# Patient Record
Sex: Female | Born: 1995 | Race: Black or African American | Hispanic: No | Smoking: Never smoker
Health system: Southern US, Community
[De-identification: ages and names within clinical notes are randomized; demographics above are authoritative.]

## PROBLEM LIST (undated history)

## (undated) DIAGNOSIS — D259 Leiomyoma of uterus, unspecified: Secondary | ICD-10-CM

## (undated) DIAGNOSIS — G43909 Migraine, unspecified, not intractable, without status migrainosus: Secondary | ICD-10-CM

## (undated) DIAGNOSIS — O3412 Maternal care for benign tumor of corpus uteri, second trimester: Secondary | ICD-10-CM

## (undated) DIAGNOSIS — R569 Unspecified convulsions: Secondary | ICD-10-CM

## (undated) DIAGNOSIS — F4310 Post-traumatic stress disorder, unspecified: Secondary | ICD-10-CM

## (undated) DIAGNOSIS — D649 Anemia, unspecified: Secondary | ICD-10-CM

## (undated) DIAGNOSIS — T8859XA Other complications of anesthesia, initial encounter: Secondary | ICD-10-CM

## (undated) DIAGNOSIS — K602 Anal fissure, unspecified: Secondary | ICD-10-CM

## (undated) DIAGNOSIS — K449 Diaphragmatic hernia without obstruction or gangrene: Secondary | ICD-10-CM

## (undated) DIAGNOSIS — F410 Panic disorder [episodic paroxysmal anxiety] without agoraphobia: Secondary | ICD-10-CM

## (undated) HISTORY — DX: Panic disorder (episodic paroxysmal anxiety): F41.0

## (undated) HISTORY — DX: Anal fissure, unspecified: K60.2

## (undated) HISTORY — DX: Leiomyoma of uterus, unspecified: D25.9

## (undated) HISTORY — DX: Unspecified convulsions: R56.9

## (undated) HISTORY — DX: Leiomyoma of uterus, unspecified: O34.12

## (undated) HISTORY — PX: ERCP W/ SPHICTEROTOMY: SHX1523

## (undated) HISTORY — PX: SPHINCTEROTOMY: SHX5279

## (undated) HISTORY — DX: Anemia, unspecified: D64.9

## (undated) HISTORY — DX: Other complications of anesthesia, initial encounter: T88.59XA

## (undated) HISTORY — DX: Migraine, unspecified, not intractable, without status migrainosus: G43.909

## (undated) HISTORY — DX: Post-traumatic stress disorder, unspecified: F43.10

## (undated) HISTORY — DX: Diaphragmatic hernia without obstruction or gangrene: K44.9

---
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
## 2016-06-08 DIAGNOSIS — F4329 Adjustment disorder with other symptoms: Secondary | ICD-10-CM | POA: Insufficient documentation

## 2016-07-26 DIAGNOSIS — F430 Acute stress reaction: Secondary | ICD-10-CM | POA: Insufficient documentation

## 2017-06-26 ENCOUNTER — Emergency Department
Admission: EM | Admit: 2017-06-26 | Discharge: 2017-06-26 | Disposition: A | Discharge status: Home / Self Care | Payer: TRICARE Prime—HMO | Attending: Emergency Medicine | Admitting: Emergency Medicine

## 2017-06-26 DIAGNOSIS — F410 Panic disorder [episodic paroxysmal anxiety] without agoraphobia: Secondary | ICD-10-CM | POA: Insufficient documentation

## 2017-06-26 DIAGNOSIS — Z8659 Personal history of other mental and behavioral disorders: Secondary | ICD-10-CM

## 2017-06-26 DIAGNOSIS — F419 Anxiety disorder, unspecified: Secondary | ICD-10-CM | POA: Insufficient documentation

## 2017-06-26 MED ORDER — LORAZEPAM 0.5 MG PO TABS
0.5000 mg | ORAL_TABLET | Freq: Once | ORAL | Status: AC
Start: 2017-06-26 — End: 2017-06-26
  Administered 2017-06-26: 13:00:00 0.5 mg via ORAL
  Filled 2017-06-26: qty 1

## 2017-06-26 NOTE — ED Notes (Signed)
NP at bedside.

## 2017-06-26 NOTE — ED Notes (Signed)
Bed: E14  Expected date:   Expected time:   Means of arrival:   Comments:

## 2017-06-26 NOTE — Discharge Instructions (Signed)
Take medication as prescribed. Rest, stay well hydrated, avoid stressful triggers.  Important to keep psychiatry appointment on Friday.  Return to the ER with any worsening of condition or concerns.

## 2017-06-26 NOTE — ED Provider Notes (Signed)
EMERGENCY DEPARTMENT NOTE    Physician/Midlevel provider first contact with patient: 06/26/17 1259         HISTORY OF PRESENT ILLNESS   Historian:Patient  Translator Used: No    Chief Complaint: Panic Attack     Mechanism of Injury:       21 y.o. female with history of panic attacks and other significant medical history listed below brought in by ambulance for having panic attack prior to arrival. Reports driving, had argument with husband, had a panic attack, pulled over and call 911. Reports was taking of Seroquel for anxiety but stopped. Takes Zoloft. Reports having psychiatry appointment on Friday. Reports palpitations, resolving SOB. Denies chest pain, fever, cough, congestion. Reviewed chart. Patient in no acute distress.    1. Location of symptoms: Panic attack  2. Onset of symptoms: Prior to arrival today  3. What was patient doing when symptoms started (Context): see above  4. Severity: moderate  5. Timing: Sudden  6. Activities that worsen symptoms: Argument   7. Activities that improve symptoms:   8. Quality: 0/10 pain  9. Radiation of symptoms: no  10. Associated signs and Symptoms: see above  11. Are symptoms worsening? Yes    MEDICAL HISTORY     Past Medical History:  Past Medical History:   Diagnosis Date   . Asthma    . Panic attacks        Past Surgical History:  No past surgical history on file.    Social History:  Social History     Social History   . Marital status: Married     Spouse name: N/A   . Number of children: N/A   . Years of education: N/A     Occupational History   . Not on file.     Social History Main Topics   . Smoking status: Never Smoker   . Smokeless tobacco: Never Used   . Alcohol use No   . Drug use: No   . Sexual activity: Not on file     Other Topics Concern   . Not on file     Social History Narrative   . No narrative on file       Family History:  No family history on file.    Outpatient Medication:  Discharge Medication List as of 06/26/2017  2:03 PM      CONTINUE these  medications which have NOT CHANGED    Details   sertraline (ZOLOFT) 100 MG tablet Take 100 mg by mouth daily., Historical Med               REVIEW OF SYSTEMS   Review of Systems   Constitutional: Negative for fever.   HENT: Negative for congestion.    Respiratory: Negative for cough and shortness of breath.    Cardiovascular: Positive for palpitations. Negative for chest pain.   Gastrointestinal: Negative for abdominal pain, diarrhea, nausea and vomiting.   Musculoskeletal: Negative for back pain and neck pain.   Neurological: Negative for dizziness, tingling, sensory change, loss of consciousness and headaches.   Psychiatric/Behavioral: Negative for suicidal ideas. The patient is nervous/anxious.    All other systems reviewed and are negative.       PHYSICAL EXAM     ED Triage Vitals [06/26/17 1253]   Enc Vitals Group      BP 123/80      Heart Rate 91      Resp Rate 20      Temp 99.1  F (37.3 C)      Temp Source Oral      SpO2 97 %      Weight 59.1 kg      Height       Head Circumference       Peak Flow       Pain Score 0      Pain Loc       Pain Edu?       Excl. in GC?      Vitals:    06/26/17 1253 06/26/17 1420   BP: 123/80 119/75   Pulse: 91 93   Resp: 20 16   Temp: 99.1 F (37.3 C) 98.1 F (36.7 C)   TempSrc: Oral Oral   SpO2: 97% 100%   Weight: 59.1 kg      Nursing note and vitals reviewed.  Constitutional:  Well developed, well nourished. Awake & Oriented x3.  Head:  Atraumatic. Normocephalic.    ENT:  Patent airway.  Neck:  Supple. Full ROM.    Cardiovascular:  Regular rate. Regular rhythm. No murmurs, rubs, or gallops.  Pulmonary/Chest:  No evidence of respiratory distress. Clear to auscultation bilaterally.  No wheezing, rales or rhonchi.   Abdominal:  Soft and non-distended. There is no tenderness. No rebound, guarding, or rigidity. Normoactive bowel sounds.  Back:  Full ROM. Nontender.  Extremities:  No edema. No cyanosis. No clubbing. Full range of motion in all extremities.  Skin:  Skin is warm and  dry.  No diaphoresis. No rash.   Neurological:  Alert, awake, and appropriate. Normal speech. Motor normal.  Psychiatric:  Good eye contact. Normal interaction, affect, and behavior.      MEDICAL DECISION MAKING     DISCUSSION    Denies discomfort at this time. Will give anxiety medication.  EKG to evaluate for arrhythmia, STEMI; see interpretation below. No concern for infectious processes requiring CBC at this time. Very little concern for ACS, PE at this time.  Most likely panic attack/anxiety.    Patient indicates improvement. Discussed medication, rest, hydration, avoiding stressful triggers, importance of keeping psychiatry appointment on Friday, when to return to the ER.    Vital Signs: Reviewed the patient?s vital signs.   Nursing Notes: Reviewed and utilized available nursing notes.  Medical Records Reviewed: Reviewed available past medical records.  Counseling: The emergency provider has spoken with the patient and discussed today?s findings, in addition to providing specific details for the plan of care.  Questions are answered and there is agreement with the plan.        CARDIAC STUDIES    The following cardiac studies were independently interpreted by the Emergency Medicine Physician.  For full cardiac study results please see chart.    EKG Interpretation:  Signed and interpreted by ED NP   Time Interpreted: 1321  Comparison: None  Rate: 90  Rhythm: NSR  Axis: normal  Intervals: normal  Blocks: no blocks  ST segments: no elevation  Interpretation: Nonspecific  EKG, NSR with no comparison      RADIOLOGY IMAGING STUDIES      No orders to display           PULSE OXIMETRY    Oxygen Saturation by Pulse Oximetry: 100%  Interventions: none  Interpretation:  WNL, interpreted by me.    EMERGENCY DEPT. MEDICATIONS      ED Medication Orders     Start Ordered     Status Ordering Provider    06/26/17 1309 06/26/17 1308  LORazepam (ATIVAN) tablet 0.5 mg  Once     Route: Oral  Ordered Dose: 0.5 mg     Last MAR action:   Given Maryruth Apple P          LABORATORY RESULTS    Ordered and independently interpreted AVAILABLE laboratory tests. Please see results section in chart for full details.  Results for orders placed or performed during the hospital encounter of 06/26/17   ECG 12 lead   Result Value Ref Range    Ventricular Rate 90 BPM    Atrial Rate 90 BPM    P-R Interval 138 ms    QRS Duration 82 ms    Q-T Interval 346 ms    QTC Calculation (Bezet) 423 ms    P Axis 69 degrees    R Axis 57 degrees    T Axis 30 degrees       CRITICAL CARE/PROCEDURES    Procedures  None    DIAGNOSIS      Diagnosis:  Final diagnoses:   Panic attack   History of anxiety       Disposition:  ED Disposition     ED Disposition Condition Date/Time Comment    Discharge  Wed Jun 26, 2017  2:03 PM Clabe Seal Bale discharge to home/self care.    Condition at disposition: Stable          Prescriptions:  Discharge Medication List as of 06/26/2017  2:03 PM      CONTINUE these medications which have NOT CHANGED    Details   sertraline (ZOLOFT) 100 MG tablet Take 100 mg by mouth daily., Historical Med                Toma Aran, [place]  06/30/17 [phone]

## 2017-06-29 LAB — ECG 12-LEAD
Atrial Rate: 90 {beats}/min
P Axis: 69 degrees
P-R Interval: 138 ms
Q-T Interval: 346 ms
QRS Duration: 82 ms
QTC Calculation (Bezet): 423 ms
R Axis: 57 degrees
T Axis: 30 degrees
Ventricular Rate: 90 {beats}/min

## 2017-07-03 ENCOUNTER — Emergency Department: Payer: TRICARE Prime—HMO

## 2017-07-03 ENCOUNTER — Emergency Department
Admission: EM | Admit: 2017-07-03 | Discharge: 2017-07-03 | Disposition: A | Discharge status: Home / Self Care | Payer: TRICARE Prime—HMO | Attending: Emergency Medicine | Admitting: Emergency Medicine

## 2017-07-03 DIAGNOSIS — S63502A Unspecified sprain of left wrist, initial encounter: Secondary | ICD-10-CM | POA: Insufficient documentation

## 2017-07-03 DIAGNOSIS — W19XXXA Unspecified fall, initial encounter: Secondary | ICD-10-CM | POA: Insufficient documentation

## 2017-07-03 MED ORDER — IBUPROFEN 400 MG PO TABS
400.0000 mg | ORAL_TABLET | Freq: Four times a day (QID) | ORAL | 0 refills | Status: AC | PRN
Start: 2017-07-03 — End: ?

## 2017-07-03 MED ORDER — IBUPROFEN 200 MG PO TABS
400.0000 mg | ORAL_TABLET | Freq: Once | ORAL | Status: AC
Start: 2017-07-03 — End: 2017-07-03
  Administered 2017-07-03: 21:00:00 400 mg via ORAL
  Filled 2017-07-03: qty 2

## 2017-07-03 NOTE — ED Triage Notes (Signed)
Kelly Sosa is a 22 y.o. female c/o left wrist pain s/p fell this morning denies taking any medication

## 2017-07-03 NOTE — ED Provider Notes (Signed)
EMERGENCY DEPARTMENT NOTE    Physician/Midlevel provider first contact with patient: 07/03/17 2059         HISTORY OF PRESENT ILLNESS   Historian: patient  Translator Used: no    21 y.o. female presents with left wrist and hand pain s/p fall.    1. Location of symptoms: see above  2. Onset of symptoms: this morning  3. What was patient doing when symptoms started (Context): left foosh injury  4. Severity: severe  5. Timing: acute onset, constant  6. Activities that worsen symptoms: movement of left hand/wrist  7. Activities that improve symptoms: resting left hand/wrist  8. Quality: sharp pain   9. Radiation of symptoms: down left hand fingers  10. Associated signs and Symptoms: no numbness, weakness   11. Are symptoms worsening? yes          MEDICAL HISTORY     Past Medical History:  Past Medical History:   Diagnosis Date   . Asthma    . Panic attacks        Past Surgical History:  History reviewed. No pertinent surgical history.    Social History:  Social History     Social History   . Marital status: Married     Spouse name: N/A   . Number of children: N/A   . Years of education: N/A     Occupational History   . Not on file.     Social History Main Topics   . Smoking status: Never Smoker   . Smokeless tobacco: Never Used   . Alcohol use No   . Drug use: No   . Sexual activity: Not on file     Other Topics Concern   . Not on file     Social History Narrative   . No narrative on file       Family History:  History reviewed. No pertinent family history.    Outpatient Medication:  Discharge Medication List as of 07/03/2017  9:56 PM      CONTINUE these medications which have NOT CHANGED    Details   sertraline (ZOLOFT) 100 MG tablet Take 100 mg by mouth daily., Historical Med             Allergies:  Allergies   Allergen Reactions   . Azithromycin Anaphylaxis       REVIEW OF SYSTEMS   Review of Systems   Musculoskeletal:        Left wrist, hand pain   All other systems reviewed and are negative.        PHYSICAL EXAM      Vitals:    07/03/17 2205   BP: 132/85   Pulse: 82   Resp: 16   Temp:    SpO2: 95%       Nursing note and vitals reviewed.    Constitutional: non-toxic apperaing  Head: Atraumatic.  Eyes: No scleral icterus.  ENT: Mucous membranes are moist and intact.   Neck: Supple.  Cardiovascular: strong regular left radial pulse  Pulmonary/Chest: No evidence of respiratory distress.   GI: Soft, non-distended abdomen.   Extremities:   Left hand, wrist- no deformity, no swelling, diffuse tenderness, no true snuff box tenderness, normal rom, normal strength  Skin: No rash.   Neurological: Awake, alert and oriented x 3. CN II-XII intact. Moving all ext.  Psychiatric: Appropriate affect. Appropriate mood. Appropriate behavior.    MEDICAL DECISION MAKING   X-rays left wrist and hand to rule out fx.  X-rays negative  Volar splint placed by tech on left wrist/hand. I inspected the splint, hand. Left hand remains nv intact per my exam.  Discussed plan including ortho follow up if pain persists over next week. D/c home.    DISCUSSION      Vital Signs: Reviewed the patient?s vital signs.   Nursing Notes: Reviewed and utilized available nursing notes.  Medical Records Reviewed: Reviewed available past medical records.  Counseling: The emergency provider has spoken with the patient and discussed today?s findings, in addition to providing specific details for the plan of care.  Questions are answered and there is agreement with the plan.    IMAGING STUDIES    The following imaging studies were independently interpreted by the Emergency Medicine Physician.  For full imaging study results please see chart.    CARDIAC STUDIES     The following cardiac studies were independently interpreted by the Emergency Medicine Physician. For full cardiac study results please see chart     PULSE OXIMETRY        EMERGENCY DEPT. MEDICATIONS      ED Medication Orders     Start Ordered     Status Ordering Provider    07/03/17 2119 07/03/17 2118  ibuprofen  (ADVIL,MOTRIN) tablet 400 mg  Once     Route: Oral  Ordered Dose: 400 mg     Last MAR action:  Given Arsenia Goracke WINDSOR          LABORATORY RESULTS    Ordered and independently interpreted AVAILABLE laboratory tests. Please see results section in chart for full details.      ATTESTATIONS      Physician Attestation: Darlyn Read MD, have been the primary provider for Thereasa Sosa during this Emergency Dept visit and have reviewed the chart for accuracy and agree with its content.     DIAGNOSIS      Diagnosis:  Final diagnoses:   Sprain of left wrist, initial encounter       Disposition:  ED Disposition     ED Disposition Condition Date/Time Comment    Discharge  Wed Jul 03, 2017  9:56 PM [name] discharge to home/self care.    Condition at disposition: Stable          Prescriptions:  Discharge Medication List as of 07/03/2017  9:56 PM      START taking these medications    Details   ibuprofen (ADVIL,MOTRIN) 400 MG tablet Take 1 tablet (400 mg total) by mouth every 6 (six) hours as needed for Pain., Starting Wed 07/03/2017, Print         CONTINUE these medications which have NOT CHANGED    Details   sertraline (ZOLOFT) 100 MG tablet Take 100 mg by mouth daily., Historical Med                [name], MD  07/03/17 2308

## 2017-07-19 DIAGNOSIS — F603 Borderline personality disorder: Secondary | ICD-10-CM | POA: Insufficient documentation

## 2017-08-07 DIAGNOSIS — F32A Depression, unspecified: Secondary | ICD-10-CM | POA: Insufficient documentation

## 2018-01-09 ENCOUNTER — Emergency Department
Admission: EM | Admit: 2018-01-09 | Discharge: 2018-01-09 | Disposition: A | Discharge status: Home / Self Care | Payer: TRICARE Prime—HMO | Attending: Emergency Medicine | Admitting: Emergency Medicine

## 2018-01-09 DIAGNOSIS — Z5321 Procedure and treatment not carried out due to patient leaving prior to being seen by health care provider: Secondary | ICD-10-CM | POA: Insufficient documentation

## 2018-01-09 HISTORY — DX: Anal fissure, unspecified: K60.2

## 2018-01-09 MED ORDER — ACETAMINOPHEN 325 MG PO TABS
650.0000 mg | ORAL_TABLET | Freq: Once | ORAL | Status: AC
Start: 2018-01-09 — End: 2018-01-09
  Administered 2018-01-09: 21:00:00 650 mg via ORAL

## 2018-01-17 ENCOUNTER — Emergency Department
Admission: EM | Admit: 2018-01-17 | Discharge: 2018-01-17 | Disposition: A | Discharge status: Home / Self Care | Payer: TRICARE Prime—HMO | Attending: General Practice | Admitting: General Practice

## 2018-01-17 DIAGNOSIS — B9689 Other specified bacterial agents as the cause of diseases classified elsewhere: Secondary | ICD-10-CM

## 2018-01-17 DIAGNOSIS — Z3A10 10 weeks gestation of pregnancy: Secondary | ICD-10-CM | POA: Insufficient documentation

## 2018-01-17 DIAGNOSIS — O219 Vomiting of pregnancy, unspecified: Secondary | ICD-10-CM | POA: Insufficient documentation

## 2018-01-17 DIAGNOSIS — N898 Other specified noninflammatory disorders of vagina: Secondary | ICD-10-CM

## 2018-01-17 DIAGNOSIS — O23591 Infection of other part of genital tract in pregnancy, first trimester: Secondary | ICD-10-CM | POA: Insufficient documentation

## 2018-01-17 LAB — URINALYSIS, REFLEX TO MICROSCOPIC EXAM IF INDICATED
Bilirubin, UA: NEGATIVE
Blood, UA: NEGATIVE
Glucose, UA: NEGATIVE
Ketones UA: NEGATIVE
Leukocyte Esterase, UA: NEGATIVE
Nitrite, UA: NEGATIVE
Protein, UR: NEGATIVE
Specific Gravity UA: 1.01 (ref 1.001–1.035)
Urine pH: 8 (ref 5.0–8.0)
Urobilinogen, UA: <2 mg/dL

## 2018-01-17 MED ORDER — DOXYLAMINE SUCCINATE (SLEEP) 25 MG PO TABS
25.0000 mg | ORAL_TABLET | Freq: Every evening | ORAL | 0 refills | Status: AC | PRN
Start: 2018-01-17 — End: ?

## 2018-01-17 MED ORDER — VITAMIN B-6 25 MG PO TABS
25.0000 mg | ORAL_TABLET | Freq: Three times a day (TID) | ORAL | 0 refills | Status: AC | PRN
Start: 2018-01-17 — End: 2018-02-16

## 2018-01-17 MED ORDER — SODIUM CHLORIDE 0.9 % IV BOLUS
1000.0000 mL | Freq: Once | INTRAVENOUS | Status: AC
Start: 2018-01-17 — End: 2018-01-17
  Administered 2018-01-17: 22:00:00 1000 mL via INTRAVENOUS

## 2018-01-17 MED ORDER — VITAMIN B-6 50 MG PO TABS
25.0000 mg | ORAL_TABLET | Freq: Every day | ORAL | Status: DC
Start: 2018-01-17 — End: 2018-01-17

## 2018-01-17 MED ORDER — VITAMIN B-6 50 MG PO TABS
25.0000 mg | ORAL_TABLET | Freq: Three times a day (TID) | ORAL | 0 refills | Status: DC | PRN
Start: 2018-01-17 — End: 2018-01-17

## 2018-01-17 MED ORDER — METRONIDAZOLE 500 MG PO TABS
500.0000 mg | ORAL_TABLET | Freq: Two times a day (BID) | ORAL | 0 refills | Status: AC
Start: 2018-01-17 — End: 2018-01-24

## 2018-01-17 NOTE — EDIE (Signed)
COLLECTIVE?NOTIFICATION?01/17/2018 20:10?Hippert, Gay A?MRN: [id number]    Criteria Met      5 ED Visits in 12 Months    Security and Safety  No recent Security Events currently on file    ED Care Guidelines  There are currently no ED Care Guidelines for this patient. Please check your facility's medical records system.          E.D. Visit Count (12 mo.)  Facility Visits   [place] [facility] 3   [place] [facility]: [facility] 1   [facility]   Total 5   Note: Visits indicate total known visits.      Recent Emergency Department Visit Summary  Date Facility [facility] Type Diagnoses or Chief Complaint   Jan 17, 2018 [place] [facility]: [facility] [place] [facility]      Emesis      Jan 09, 2018 [place] [facility] [place] [facility]      triage- anal fissures concerns for blood clot ( pregnant)      Hemorrhoids      Jul 03, 2017 [place] [facility] [place] [facility]      left wrist      Wrist Pain      Unspecified sprain of left wrist, initial encounter      Jun 26, 2017 [place] [facility] [place] [facility]      MEDIC      Panic Attack      Personal history of other mental and behavioral disorders      Panic disorder [episodic paroxysmal anxiety]      May 03, 2017 [facility] [place] [facility]      Other acute postprocedural pain      Other specified diseases of anus and rectum          Recent Inpatient Visit Summary  No recorded inpatient visits.     Care Providers  There are no care providers on record at this time.   Collective Portal  This patient has registered at the [facility] Emergency Room: [facility] Emergency Department   For more information visit: [email or web address]     PLEASE NOTE:    1.   Any care recommendations and other clinical information are provided as guidelines or for historical purposes only, and providers should exercise their own clinical  judgment when providing care.    2.   You may only use this information for purposes of treatment, payment or health care operations activities, and subject to the limitations of applicable Collective Policies.    3.   You should consult directly with the organization that provided a care guideline or other clinical history with any questions about additional information or accuracy or completeness of information provided.    ? 2019 [place], [employer] - [email or web address]

## 2018-01-17 NOTE — ED Notes (Signed)
Pt discharged to home. Pt A&O x 4, resp is reg and unlabored, skin is warm and dry. Discharge instructions give and information about F/U care with questions answered. Pt verbalize understanding of  Instructions. Pt ambulating to lobby with steady gait.

## 2018-01-17 NOTE — ED Triage Notes (Addendum)
Pt sts that she has been vomiting today multiple times. Pt also C/O groin pain/pressure with urination. Pt has had vaginal spotting x 2 but is not spotting today. She has received a dose of Rhogam at the end of April

## 2018-01-17 NOTE — ED Provider Notes (Signed)
EMERGENCY DEPARTMENT NOTE    Physician/Midlevel provider first contact with patient: 01/17/18 2012         HISTORY OF PRESENT ILLNESS   Historian:Patient  Translator Used: No    Chief Complaint: Emesis     Mechanism of Injury:       22 y.o. female G1 at 10 weeks by last menstrual period presents to ED with nausea and vomiting as well as foul smelling vaginal discharge.  Patient states throughout her pregnancy she has had nausea and normally one episode of emesis each day, however today has vomited 3 times.  Not currently on any medication for nausea of pregnancy.  Patient also states she has had white, foul smelling discharge for one week that started after she had sexual intercourse, sexual partner was wearing a condom.  Denies F/C, abd pain, dysuria, hematuria, vaginal bleeding, diarrhea, abdominal trauma.      MEDICAL HISTORY     Past Medical History:  Past Medical History:   Diagnosis Date   . Anal fissure     pt states hx of anal fissures 5.23.19   . Panic attacks        Past Surgical History:  Past Surgical History:   Procedure Laterality Date   . anal      spinterotomy       Social History:  Social History     Social History   . Marital status: Married     Spouse name: N/A   . Number of children: N/A   . Years of education: N/A     Occupational History   . Not on file.     Social History Main Topics   . Smoking status: Never Smoker   . Smokeless tobacco: Never Used   . Alcohol use No   . Drug use: No   . Sexual activity: Not on file     Other Topics Concern   . Not on file     Social History Narrative   . No narrative on file       Family History:  History reviewed. No pertinent family history.    Outpatient Medication:  Discharge Medication List as of 01/17/2018 10:05 PM      CONTINUE these medications which have NOT CHANGED    Details   ibuprofen (ADVIL,MOTRIN) 400 MG tablet Take 1 tablet (400 mg total) by mouth every 6 (six) hours as needed for Pain., Starting Wed 07/03/2017, Print               REVIEW OF  SYSTEMS   Review of Systems   Gastrointestinal: Positive for nausea and vomiting.   All other systems reviewed and are negative.           PHYSICAL EXAM     ED Triage Vitals   Enc Vitals Group      BP 01/17/18 2017 125/69      Heart Rate 01/17/18 2017 82      Resp Rate 01/17/18 2017 18      Temp 01/17/18 2017 98.8 F (37.1 C)      Temp src --       SpO2 01/17/18 2017 98 %      Weight 01/17/18 2017 59 kg      Height 01/17/18 2017 1.626 m      Head Circumference --       Peak Flow --       Pain Score 01/17/18 2027 5      Pain Loc --  Pain Edu? --       Excl. in GC? --      Physical Exam   Constitutional: She appears well-developed. No distress.   HENT:   Head: Normocephalic and atraumatic.   Eyes: Conjunctivae and EOM are normal.   Neck: Normal range of motion. Neck supple.   Cardiovascular: Normal rate.    Pulmonary/Chest: Effort normal.   Abdominal: Soft. She exhibits no distension. There is tenderness. There is no guarding.   +epigastric TTP   Genitourinary: Vaginal discharge found.   Genitourinary Comments: +yellow/white vaginal discharge   No blood noted in vaginal canal    Musculoskeletal: Normal range of motion.   Neurological: She is alert.   Skin: Skin is warm. She is not diaphoretic.   Psychiatric: She has a normal mood and affect.   Nursing note and vitals reviewed.        MEDICAL DECISION MAKING     DISCUSSION    Well appearing, NAD in ED.  Discussed treatment options for nausea and vomiting of pregnancy, will discharge on vitamin b6 and unisom.  IVFs given in ED for hydration.  UA negative for infection.  Pelvic exam consistent with bacterial vaginosis, metronidazole prescribed.  Trichomoniasis screening negative.  GC/Chlamydia pending.  Patient aware of pending cultures and comfortable with discharge on treatment as above, will receive call if cultures positive.  OB follow up recommended and return precautions given.  All questions answered.        Vital Signs: Reviewed the patient?s vital signs.    Nursing Notes: Reviewed and utilized available nursing notes.  Medical Records Reviewed: Reviewed available past medical records.  Counseling: The emergency provider has spoken with the patient and discussed today?s findings, in addition to providing specific details for the plan of care.  Questions are answered and there is agreement with the plan.      MIPS DOCUMENTATION        CARDIAC STUDIES    The following cardiac studies were independently interpreted by the Emergency Medicine Physician.  For full cardiac study results please see chart.    Monitor Strip  Interpreted by ED Physician  Rate: 82  Rhythm: NSR   ST Changes: none      EMERGENCY IMAGING STUDIES    The following imagine studies were independently interpreted by me (emergency physician):        RADIOLOGY IMAGING STUDIES      No orders to display           PULSE OXIMETRY    Oxygen Saturation by Pulse Oximetry: 100%  Interventions: none  Interpretation:  Normal     EMERGENCY DEPT. MEDICATIONS      ED Medication Orders     Start Ordered     Status Ordering Provider    01/17/18 2130 01/17/18 2129  sodium chloride 0.9 % bolus 1,000 mL  Once     Route: Intravenous  Ordered Dose: 1,000 mL     Last MAR action:  Stopped Serapio Edelson L    01/17/18 2053 01/17/18 2052  vitamin B-6 (PYRIDOXINE) tablet 25 mg  Daily     Route: Oral  Ordered Dose: 25 mg     Last MAR action:  Not Given Adleigh Mcmasters L          LABORATORY RESULTS    Ordered and independently interpreted AVAILABLE laboratory tests. Please see results section in chart for full details.  Results for orders placed or performed during the hospital encounter of 01/17/18  UA with reflex to micro (all hospital ED's and [facility], pts 3 + yrs)   Result Value Ref Range    Urine Type Clean Catch     Color, UA Yellow Clear - Yellow    Clarity, UA Clear Clear - Hazy    Specific Gravity UA 1.010 1.001 - 1.035    Urine pH 8.0 5.0 - 8.0    Leukocyte Esterase, UA NEGATIVE Negative    Nitrite, UA  NEGATIVE Negative    Protein, UR NEGATIVE Negative    Glucose, UA NEGATIVE Negative    Ketones UA NEGATIVE Negative    Urobilinogen, UA <2.0 0.2 - 2.0 mg/dL    Bilirubin, UA NEGATIVE Negative    Blood, UA NEGATIVE Negative       CRITICAL CARE/PROCEDURES    Procedures      DIAGNOSIS      Diagnosis:  Final diagnoses:   Vomiting or nausea of pregnancy   Vaginal discharge   Bacterial vaginosis       Disposition:  ED Disposition     ED Disposition Condition Date/Time Comment    Discharge  Fri Jan 17, 2018  9:47 PM Clabe Seal Hawn discharge to home/self care.    Condition at disposition: Stable          Prescriptions:  Discharge Medication List as of 01/17/2018 10:05 PM      START taking these medications    Details   doxylamine succinate (UNISOM) 25 MG tablet Take 1 tablet (25 mg total) by mouth nightly as needed (nausea), Starting Fri 01/17/2018, Print      metroNIDAZOLE (FLAGYL) 500 MG tablet Take 1 tablet (500 mg total) by mouth 2 (two) times daily for 7 days, Starting Fri 01/17/2018, Until Fri 01/24/2018, Print      Pyridoxine HCl (VITAMIN B-6) 25 MG tablet Take 1 tablet (25 mg total) by mouth 3 (three) times daily as needed (nausea), Starting Fri 01/17/2018, Until Sun 02/16/2018, Print         CONTINUE these medications which have NOT CHANGED    Details   ibuprofen (ADVIL,MOTRIN) 400 MG tablet Take 1 tablet (400 mg total) by mouth every 6 (six) hours as needed for Pain., Starting Wed 07/03/2017, Print                [place], Lavontae Cornia L, DO  01/17/18 2236

## 2018-01-17 NOTE — Discharge Instructions (Signed)
Antibiotics     Some Important Information You Need To Know.     Antibiotics are strong medicines that can kill bacteria. They can save lives. They keep bacterial infections from becoming more serious. Antibiotics do not work against infections that are caused by viruses. They will not make viral illnesses shorter. Overall, infections caused by viruses are much more common than those caused by bacteria.     Viral infections include:   Bronchitis.   Colds.   Flu (influenza).   Most coughs.   Most ear infections.   Most sore throats.   Stomach flu (viral gastroenteritis).    Bacterial infections include:   Bladder infections.   Skin and wound infections.   Sinus infections that last more than 2 weeks.   Some ear infections.   Strep throat (about 10% (1 in 10) of sore throats).   Pneumonia (lung infection).    It is very important to limit the use of antibiotic medicines to treat only bacterial infections. When antibiotics are given for a viral illness, it can be hard to know if symptoms, including rash, are caused by the virus or an allergy to an antibiotic. This can cause problems in the future when antibiotics are needed to treat a bacterial infection.     Using antibiotics when they are not needed can cause bacteria to become resistant. It can also happen when antibiotics are not taken correctly. This means that the standard antibiotics may not be able to kill the bacteria that is making you ill.    Infections caused by drug-resistant bacteria:   Are much harder to treat.   Need more tests.   May require you to be checked into the hospital.   Are more expensive to treat.   Have a higher chance of complications including death.    Antibiotics can also have side effects. The most common side effects are nausea (sick to the stomach), vomiting (throwing up), diarrhea, and rashes.               Vaginal Discharge    You have been diagnosed with a vaginal discharge.    Discharge (drainage) from  the vagina often means there is an inflammation in the vagina. This is called "vaginitis." Some other vaginitis symptoms are: Itching, burning and odor. Urinating (peeing) may be uncomfortable.    The most common causes were checked during the evaluation today. However, other possible causes may need further evaluation by your family doctor or gynecologist.    Vaginal discharge and vaginitis have many causes. Some are:   Hormonal changes from menopause in older women.   Yeast Infections (Candida vaginitis): This happens when yeast normally living in the vagina grows out of control. The normal (good) bacteria living in the vagina can be killed or weakened. This allows the yeast to overgrow. This can happen if you used an antibiotic recently. Recent illness and long-term diabetes can weaken the immune system. This means your body may not be able to keep the yeast under control.   Nonspecific vaginitis: This results from abnormal bacterial overgrowth of the bacteria that usually live in the vagina. This may be caused by recent illness. It can also be caused by some medicines and douching. Gardnerella vaginitis (also called "Bacterial Vaginosis") is a common infection that may result from overgrowth. Rarely, Gardnerella may be a sexually transmitted disease.   Sexually Transmitted Diseases: Some STDs cause a lot of frothy discharge that smells very bad and is green. Trichomoniasis ("trich")  is one such STD. Chlamydia can also cause discharge. However, it may have little or no symptoms of which you are aware. Chlamydia may cause a severe (serious) disease called Pelvic Inflammatory Disease (PID). It may also cause the reproductive organs to scar, which causes chronic (ongoing) pelvic pain and infertility (the inability to have babies). Gonorrhea "GC" may cause heavy purulent (pus-like) discharge. It can also cause PID. Most STDs pass easily between sexual partners. You and your sexual partner(s) would need to be  treated.   Viral vaginitis: Herpes Simplex is often the cause. This causes a thin, watery discharge. It is linked with painful blisters on the vagina and swelling of the vaginal lips. Urination and sexual intercourse (sex) may be painful. It is a very infectious STD. Symptoms can be treated but there is no cure.   Allergic vaginitis: The vagina gets irritated from coming into contact with certain substances. These include latex condoms, spermicidal jellies, soaps and bubble baths. It also includes vaginal douche products.    If it is confirmed or suspected that you have an STD, tell your sexual partner(s). Then your partner can be checked by a doctor or the local health department.    To protect yourself and your sexual partner from STDs, use a condom during intercourse (sex), especially if your diagnosis is uncertain and you are waiting for the results of pelvic cultures done during this visit.    The health care provider who saw you feels it is OK to send you home.    You may need to return here or go to the nearest Emergency Department if more symptoms develop. This might mean that you are having complications like worsening infection or bleeding. There could also be some other undiagnosed problem.    It is important to have a follow-up. See your gynecologist or family doctor in the next few days. The medical staff may have told your doctor about this visit. If not, be sure to do so yourself.   If you don't have the right follow-up care available, tell members of the medical staff before you go. They can help make arrangements for you.    YOU SHOULD SEEK MEDICAL ATTENTION IMMEDIATELY, EITHER HERE OR AT THE NEAREST EMERGENCY DEPARTMENT, IF ANY OF THE FOLLOWING OCCURS:   Increasing pain in the abdomen (belly), pelvis or back.   Large amounts of vaginal discharge or bleeding, passing large blood clots.   Fever (temperature higher than 100.72F / 38C), chills, nausea, vomiting (throwing up).   You are  dizzy, lightheaded, or pass out.

## 2018-01-20 LAB — GENITAL CHLAMYDIA/NEISSERIA BY PCR
Chlamydia DNA by PCR: NEGATIVE
Neisseria gonorrhoeae by PCR: NEGATIVE

## 2019-07-14 ENCOUNTER — Other Ambulatory Visit: Payer: Self-pay

## 2019-07-14 ENCOUNTER — Emergency Department (HOSPITAL_COMMUNITY)
Admission: EM | Admit: 2019-07-14 | Discharge: 2019-07-14 | Disposition: A | Discharge status: Home / Self Care | Payer: No Typology Code available for payment source | Attending: Emergency Medicine | Admitting: Emergency Medicine

## 2019-07-14 ENCOUNTER — Encounter (HOSPITAL_COMMUNITY): Payer: Self-pay

## 2019-07-14 ENCOUNTER — Ambulatory Visit (INDEPENDENT_AMBULATORY_CARE_PROVIDER_SITE_OTHER)
Admission: EM | Admit: 2019-07-14 | Discharge: 2019-07-14 | Disposition: A | Discharge status: Home / Self Care | Payer: No Typology Code available for payment source | Source: Home / Self Care

## 2019-07-14 DIAGNOSIS — J039 Acute tonsillitis, unspecified: Secondary | ICD-10-CM

## 2019-07-14 DIAGNOSIS — J029 Acute pharyngitis, unspecified: Secondary | ICD-10-CM

## 2019-07-14 LAB — GROUP A STREP BY PCR: Group A Strep by PCR: NOT DETECTED

## 2019-07-14 MED ORDER — DOXYCYCLINE HYCLATE 100 MG PO CAPS: 100.0000 mg | ORAL_CAPSULE | Freq: Two times a day (BID) | ORAL | 0 refills | Status: AC

## 2019-07-14 MED ORDER — LIDOCAINE VISCOUS HCL 2 % MT SOLN: 15.0000 mL | OROMUCOSAL | 0 refills | Status: DC | PRN

## 2019-07-14 MED ORDER — METHYLPREDNISOLONE 4 MG PO TBPK: ORAL_TABLET | ORAL | 0 refills | Status: DC

## 2019-07-14 MED ORDER — IBUPROFEN 600 MG PO TABS: 600.0000 mg | ORAL_TABLET | Freq: Four times a day (QID) | ORAL | 0 refills | Status: DC | PRN

## 2019-07-14 NOTE — ED Triage Notes (Signed)
Patient c/o sore throat pain since x 2 days. patienat denies any fever.

## 2019-07-14 NOTE — Discharge Instructions (Addendum)
Ibuprofen 600 mg every 6 hours as needed for pain.  We will call you if your strep test is positive and call you in an antibiotic if that is the case.  Return to the ER if symptoms worsen or change.

## 2019-07-14 NOTE — Discharge Instructions (Addendum)
°  While your throat is sore, eat soft or liquid foods, such as sherbet, soups, or instant breakfast drinks. Drink warm liquids. Eat frozen ice pops. Rest as much as possible and get plenty of sleep. Gargle with a salt-water mixture 3-4 times a day or as needed. To make a salt-water mixture, completely dissolve -1 tsp of salt in 1 cup of warm water. Wash your hands regularly with soap and water. If soap and water are not available, use hand sanitizer. Do not share cups, bottles, or other utensils until your symptoms have gone away.  Take Care!  Aldona Bar

## 2019-07-14 NOTE — ED Provider Notes (Signed)
[facility]    CSN: [id number] Arrival date & time: 07/14/19  1049      History   Chief Complaint Chief Complaint  Patient presents with  . Sore Throat    HPI Gina Pearson is a 23 y.o. female.   Subjective:   Gina Pearson is a 23 y.o. female who presents for evaluation of a sore throat. She denies any fevers, chills, congestion, headache, myalgias, nasal blockage, post nasal drip, shortness of breath, cough, nausea, vomiting, diarrhea, swollen glands, wheezing or white spots in throat. Onset of symptoms was 3 days ago and has been gradually worsening since that time. She is drinking plenty of fluids but reports that it is difficult for her to swallow due to the pain. She denies any recent close exposure to someone with proven streptococcal pharyngitis or COVID-19.   Notably, she was evaluated a few hours ago for the same at [facility] ED. She had a strep PCR done. She subsequently had to leave because her son was getting cranky. She was not given any prescriptions at the time.   The following portions of the patient's history were reviewed and updated as appropriate: allergies, current medications, past family history, past medical history, past social history, past surgical history and problem list.       History reviewed. No pertinent past medical history.  There are no active problems to display for this patient.   Past Surgical History:  Procedure Laterality Date  . CESAREAN SECTION    . ERCP W/ SPHICTEROTOMY      OB History   No obstetric history on file.      Home Medications    Prior to Admission medications   Medication Sig Start Date End Date Taking? Authorizing Provider  doxycycline (VIBRAMYCIN) 100 MG capsule Take 1 capsule (100 mg total) by mouth 2 (two) times daily for 7 days. 07/14/19 07/21/19  Enrique Sack, FNP  ibuprofen (ADVIL) 600 MG tablet Take 1 tablet (600 mg total) by mouth every 6 (six) hours as needed. 07/14/19   Enrique Sack, FNP  lidocaine (XYLOCAINE) 2 % solution Use as directed 15 mLs in the mouth or throat every 3 (three) hours as needed for mouth pain. Gargle with 15 mLs every 3 hours as needed for sore throat 07/14/19   Enrique Sack, FNP  methylPREDNISolone (MEDROL DOSEPAK) 4 MG TBPK tablet Take as directed 07/14/19   Enrique Sack, FNP    Family History Family History  Family history unknown: Yes    Social History Social History   Tobacco Use  . Smoking status: Never Smoker  . Smokeless tobacco: Never Used  Substance Use Topics  . Alcohol use: Not on file  . Drug use: Not on file     Allergies   Azithromycin   Review of Systems Review of Systems  Constitutional: Negative for chills and fever.  HENT: Positive for sore throat. Negative for trouble swallowing.   Respiratory: Negative for cough and shortness of breath.   Gastrointestinal: Negative for diarrhea, nausea and vomiting.  Musculoskeletal: Negative for myalgias.  Neurological: Negative for dizziness and headaches.     Physical Exam Triage Vital Signs ED Triage Vitals  Enc Vitals Group     BP 07/14/19 1122 132/89     Pulse Rate 07/14/19 1122 78     Resp 07/14/19 1122 17     Temp 07/14/19 1122 99.3 F (37.4 C)     Temp Source 07/14/19 1122 Oral     SpO2 07/14/19  1122 98 %     Weight --      Height --      Head Circumference --      Peak Flow --      Pain Score 07/14/19 1118 9     Pain Loc --      Pain Edu? --      Excl. in [place]? --    No data found.  Updated Vital Signs BP 132/89 (BP Location: Right Arm)   Pulse 78   Temp 99.3 F (37.4 C) (Oral)   Resp 17   LMP 06/21/2019   SpO2 98%   Visual Acuity Right Eye Distance:   Left Eye Distance:   Bilateral Distance:    Right Eye Near:   Left Eye Near:    Bilateral Near:     Physical Exam Vitals signs reviewed.  Constitutional:      Appearance: She is well-developed.  HENT:     Head: Normocephalic.     Right Ear: Tympanic membrane and  ear canal normal.     Left Ear: Tympanic membrane and ear canal normal.     Mouth/Throat:     Lips: Pink.     Mouth: Mucous membranes are moist.     Pharynx: Pharyngeal swelling and posterior oropharyngeal erythema present.     Tonsils: No tonsillar exudate or tonsillar abscesses.   Eyes:     Conjunctiva/sclera: Conjunctivae normal.     Pupils: Pupils are equal, round, and reactive to light.  Neck:     Musculoskeletal: Normal range of motion and neck supple.  Cardiovascular:     Rate and Rhythm: Normal rate and regular rhythm.  Pulmonary:     Effort: Pulmonary effort is normal.     Breath sounds: Normal breath sounds.  Skin:    General: Skin is warm and dry.  Neurological:     Mental Status: She is alert.      UC Treatments / Results  Labs (all labs ordered are listed, but only abnormal results are displayed) Labs Reviewed - No data to display  EKG   Radiology No results found.  Procedures Procedures (including critical care time)  Medications Ordered in UC Medications - No data to display  Initial Impression / Assessment and Plan / UC Course  I have reviewed the triage vital signs and the nursing notes.  Pertinent labs & imaging results that were available during my care of the patient were reviewed by me and considered in my medical decision making (see chart for details).    22 yo female presenting with pharyngitis/tonsilitis. Strep PCR pending. Patient afebrile. Nontoxic appearing. She is able to swallow effectively and manage oral secretions without difficulty. Patient placed on antibiotics and steroids.  Use of OTC analgesics recommended as well as salt water gargles. Follow up as needed.  Today's evaluation has revealed no signs of a dangerous process. Discussed diagnosis with patient and/or guardian. Patient and/or guardian aware of their diagnosis, possible red flag symptoms to watch out for and need for close follow up. Patient and/or guardian understands  verbal and written discharge instructions. Patient and/or guardian comfortable with plan and disposition.  Patient and/or guardian has a clear mental status at this time, good insight into illness (after discussion and teaching) and has clear judgment to make decisions regarding their care  This care was provided during an unprecedented National Emergency due to the Novel Coronavirus (COVID-19) pandemic. COVID-19 infections and transmission risks place heavy strains on healthcare resources.  As  this pandemic evolves, our facility, providers, and staff strive to respond fluidly, to remain operational, and to provide care relative to available resources and information. Outcomes are unpredictable and treatments are without well-defined guidelines. Further, the impact of COVID-19 on all aspects of urgent care, including the impact to patients seeking care for reasons other than COVID-19, is unavoidable during this national emergency. At this time of the global pandemic, management of patients has significantly changed, even for non-COVID positive patients given high local and regional COVID volumes at this time requiring high healthcare system and resource utilization. The standard of care for management of both COVID suspected and non-COVID suspected patients continues to change rapidly at the local, regional, national, and global levels. This patient was worked up and treated to the best available but ever changing evidence and resources available at this current time.   Documentation was completed with the aid of voice recognition software. Transcription may contain typographical errors.  Final Clinical Impressions(s) / UC Diagnoses   Final diagnoses:  Acute tonsillitis, unspecified etiology     Discharge Instructions       While your throat is sore, eat soft or liquid foods, such as sherbet, soups, or instant breakfast drinks.  Drink warm liquids.  Eat frozen ice pops.  Rest as much as  possible and get plenty of sleep.  Gargle with a salt-water mixture 3-4 times a day or as needed. To make a salt-water mixture, completely dissolve -1 tsp of salt in 1 cup of warm water.  Wash your hands regularly with soap and water. If soap and water are not available, use hand sanitizer.  Do not share cups, bottles, or other utensils until your symptoms have gone away.  Take Care!  [facility]     ED Prescriptions    Medication Sig Dispense Auth. Provider   doxycycline (VIBRAMYCIN) 100 MG capsule Take 1 capsule (100 mg total) by mouth 2 (two) times daily for 7 days. 14 capsule Enrique Sack, FNP   methylPREDNISolone (MEDROL DOSEPAK) 4 MG TBPK tablet Take as directed 21 tablet Meral Geissinger, FNP   lidocaine (XYLOCAINE) 2 % solution Use as directed 15 mLs in the mouth or throat every 3 (three) hours as needed for mouth pain. Gargle with 15 mLs every 3 hours as needed for sore throat 120 mL Enrique Sack, FNP   ibuprofen (ADVIL) 600 MG tablet Take 1 tablet (600 mg total) by mouth every 6 (six) hours as needed. 30 tablet Enrique Sack, FNP     PDMP not reviewed this encounter.   Enrique Sack,  07/14/19 1148

## 2019-07-14 NOTE — ED Triage Notes (Signed)
Pt presents with sore throat X 5 days .

## 2019-07-14 NOTE — ED Provider Notes (Signed)
[facility] DEPT Provider Note   CSN: [id number] Arrival date & time: 07/14/19  0911     History   Chief Complaint Chief Complaint  Patient presents with  . Sore Throat    HPI Gina Pearson is a 23 y.o. female.     Patient is a 23 year old female with no significant past medical history.  She presents today with complaints of sore throat.  Symptoms have been present for 2 days and worsening.  She describes discomfort to the right side of her throat that is worse when she swallows or eats.  She denies any fevers or chills.  She denies any ill contacts.  The history is provided by the patient.  Sore Throat This is a new problem. The current episode started 2 days ago. The problem occurs constantly. The problem has been gradually worsening. Pertinent negatives include no abdominal pain and no shortness of breath. The symptoms are aggravated by swallowing. Nothing relieves the symptoms. She has tried nothing for the symptoms.    History reviewed. No pertinent past medical history.  There are no active problems to display for this patient.   Past Surgical History:  Procedure Laterality Date  . CESAREAN SECTION    . SPHINCTEROTOMY       OB History   No obstetric history on file.      Home Medications    Prior to Admission medications   Not on File    Family History Family History  Problem Relation Age of Onset  . Hypertension Mother     Social History Social History   Tobacco Use  . Smoking status: Never Smoker  . Smokeless tobacco: Never Used  Substance Use Topics  . Alcohol use: Yes  . Drug use: Never     Allergies   Azithromycin   Review of Systems Review of Systems  Respiratory: Negative for shortness of breath.   Gastrointestinal: Negative for abdominal pain.  All other systems reviewed and are negative.    Physical Exam Updated Vital Signs BP 134/84   Pulse 86   Temp 98.2 F (36.8 C) (Oral)   Resp 15    Ht 5\' 4"  (1.626 m)   Wt 60.3 kg   LMP 06/21/2019 (Approximate)   SpO2 100%   BMI 22.83 kg/m   Physical Exam Vitals signs and nursing note reviewed.  Constitutional:      General: She is not in acute distress.    Appearance: She is well-developed. She is not diaphoretic.  HENT:     Head: Normocephalic and atraumatic.     Mouth/Throat:     Mouth: Mucous membranes are moist.     Pharynx: Posterior oropharyngeal erythema present. No pharyngeal swelling or oropharyngeal exudate.     Tonsils: No tonsillar exudate or tonsillar abscesses.  Neck:     Musculoskeletal: Normal range of motion and neck supple.  Cardiovascular:     Rate and Rhythm: Normal rate and regular rhythm.     Heart sounds: No murmur. No friction rub. No gallop.   Pulmonary:     Effort: Pulmonary effort is normal. No respiratory distress.     Breath sounds: Normal breath sounds. No wheezing.  Abdominal:     General: Bowel sounds are normal. There is no distension.     Palpations: Abdomen is soft.     Tenderness: There is no abdominal tenderness.  Musculoskeletal: Normal range of motion.  Skin:    General: Skin is warm and dry.  Neurological:  Mental Status: She is alert and oriented to person, place, and time.      ED Treatments / Results  Labs (all labs ordered are listed, but only abnormal results are displayed) Labs Reviewed  GROUP A STREP BY PCR    EKG None  Radiology No results found.  Procedures Procedures (including critical care time)  Medications Ordered in ED Medications - No data to display   Initial Impression / Assessment and Plan / ED Course  I have reviewed the triage vital signs and the nursing notes.  Pertinent labs & imaging results that were available during my care of the patient were reviewed by me and considered in my medical decision making (see chart for details).  Patient presenting with complaints of sore throat.  A strep test will be obtained.  Patient states  that she needs to leave.  She will be discharged and notified if her strep test returns positive.  To return as needed for any problems.  Final Clinical Impressions(s) / ED Diagnoses   Final diagnoses:  None    ED Discharge Orders    None       Veryl Speak, MD 07/14/19 1014

## 2020-03-12 ENCOUNTER — Ambulatory Visit (HOSPITAL_COMMUNITY)
Admission: EM | Admit: 2020-03-12 | Discharge: 2020-03-12 | Disposition: A | Discharge status: Home / Self Care | Payer: No Typology Code available for payment source | Attending: Family Medicine | Admitting: Family Medicine

## 2020-03-12 ENCOUNTER — Other Ambulatory Visit: Payer: Self-pay

## 2020-03-12 ENCOUNTER — Encounter (HOSPITAL_COMMUNITY): Payer: Self-pay

## 2020-03-12 DIAGNOSIS — Z3202 Encounter for pregnancy test, result negative: Secondary | ICD-10-CM | POA: Diagnosis not present

## 2020-03-12 DIAGNOSIS — N898 Other specified noninflammatory disorders of vagina: Secondary | ICD-10-CM | POA: Insufficient documentation

## 2020-03-12 LAB — POCT URINALYSIS DIP (DEVICE)
Glucose, UA: NEGATIVE mg/dL
Hgb urine dipstick: NEGATIVE
Leukocytes,Ua: NEGATIVE
Nitrite: NEGATIVE
Protein, ur: 100 mg/dL — AB
Specific Gravity, Urine: 1.025 (ref 1.005–1.030)
Urobilinogen, UA: 1 mg/dL (ref 0.0–1.0)
pH: 7 (ref 5.0–8.0)

## 2020-03-12 LAB — POC URINE PREG, ED: Preg Test, Ur: NEGATIVE
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
## 2020-03-12 NOTE — Discharge Instructions (Addendum)
Your swab tests are pending.  If your test results are positive, we will call you.  You may need additional treatment and your partner(s) may also need treatment.      Results will also be available via mychart  Follow up as needed
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
## 2020-03-14 LAB — CERVICOVAGINAL ANCILLARY ONLY
Bacterial Vaginitis (gardnerella): NEGATIVE
Candida Glabrata: NEGATIVE
Candida Vaginitis: NEGATIVE
Chlamydia: NEGATIVE
Comment: NEGATIVE
Comment: NEGATIVE
Comment: NEGATIVE
Comment: NEGATIVE
Comment: NEGATIVE
Comment: NORMAL
Neisseria Gonorrhea: NEGATIVE
Trichomonas: NEGATIVE

## 2020-11-03 ENCOUNTER — Encounter (HOSPITAL_BASED_OUTPATIENT_CLINIC_OR_DEPARTMENT_OTHER): Payer: Self-pay

## 2021-08-17 ENCOUNTER — Telehealth: Payer: Self-pay | Admitting: *Deleted

## 2021-08-17 NOTE — Telephone Encounter (Signed)
Has not been scheduled for New OB appointment from referral from [place] as of this time today. Gina Pearson is having spotting off and on for the last 2 weeks that has now turned to dark red blood as of today. Also having bad headaches, light headed, and 10 lb. Wt. Loss. Clinical staff has advised that patient have MAU evaluation due to bleeding in early pregnancy. LMP is 07/02/21 and is [redacted]w[redacted]d according to LMP, to rule out Ectopic or miscarriage. Patient will schedule New OB appointment after MAU evaluation.

## 2021-09-04 ENCOUNTER — Encounter: Payer: Self-pay | Admitting: *Deleted

## 2021-09-08 ENCOUNTER — Encounter: Payer: Self-pay | Admitting: Obstetrics and Gynecology

## 2021-09-08 ENCOUNTER — Ambulatory Visit (INDEPENDENT_AMBULATORY_CARE_PROVIDER_SITE_OTHER): Payer: No Typology Code available for payment source | Admitting: Obstetrics and Gynecology

## 2021-09-08 ENCOUNTER — Other Ambulatory Visit (HOSPITAL_COMMUNITY)
Admission: RE | Admit: 2021-09-08 | Discharge: 2021-09-08 | Disposition: A | Discharge status: Home / Self Care | Source: Ambulatory Visit | Attending: Obstetrics and Gynecology | Admitting: Obstetrics and Gynecology

## 2021-09-08 ENCOUNTER — Other Ambulatory Visit: Payer: Self-pay

## 2021-09-08 DIAGNOSIS — Z6791 Unspecified blood type, Rh negative: Secondary | ICD-10-CM | POA: Diagnosis not present

## 2021-09-08 DIAGNOSIS — Z3480 Encounter for supervision of other normal pregnancy, unspecified trimester: Secondary | ICD-10-CM | POA: Insufficient documentation

## 2021-09-08 DIAGNOSIS — F445 Conversion disorder with seizures or convulsions: Secondary | ICD-10-CM | POA: Insufficient documentation

## 2021-09-08 NOTE — Progress Notes (Signed)
Bedside U/S shows single IUP with FHT of 176  CRL measures 30.79mm  GA 10w which is consistent with her early U/S in [place]

## 2021-09-08 NOTE — Progress Notes (Signed)
History:   Gina Pearson is a 26 y.o. G2P1001 at [redacted]w[redacted]d by LMP, early ultrasound being seen today for her first obstetrical visit.  Her obstetrical history is significant for pseudoseizure, anemia,  and migraines.  Patient does intend to breast feed. Pregnancy history fully reviewed. Unplanned pregnancy, however doing ok with it.   History of Seizures- pseudo seizures, had full workup with Neuro including EEG which was negative. She is not on medications.   Patient reports no complaints. Reports increased stress with pregnancy. She is almost done with school. 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 Her and partner are not longer together, however partner desires relationship with baby.       HISTORY: OB History  Gravida Para Term Preterm AB Living  2 1 1  0 0 1  SAB IAB Ectopic Multiple Live Births  0 0 0 0 0    # Outcome Date GA Lbr Len/2nd Weight Sex Delivery Anes PTL Lv  2 Current           1 Term 08/01/18     CS-[id number]       Last pap smear was done 2022 with biopsy. [facility]   Past Medical History:  Diagnosis Date   Anal fissure    Anal fissure    Anemia    Hiatal hernia    Migraines    PTSD (post-traumatic stress disorder)    Seizures ([place])    Past Surgical History:  Procedure Laterality Date   CESAREAN SECTION     ERCP W/ SPHICTEROTOMY     SPHINCTEROTOMY     Family History  Problem Relation Age of Onset   Hypertension Mother    Social History   Tobacco Use   Smoking status: Never   Smokeless tobacco: Never  Vaping Use   Vaping Use: Never used  Substance Use Topics   Alcohol use: Yes   Drug use: Never   Allergies  Allergen Reactions   Azithromycin     Other reaction(s): Airway constriction   Ceftriaxone Other (See Comments)   Other     Patient reports she was told by [facility] Pulmonologist that she is allergic to dust mites.    Sumatriptan     Other reaction(s): Dysphagia   No current outpatient medications on file  prior to visit.   No current facility-administered medications on file prior to visit.    Review of Systems Pertinent items noted in HPI and remainder of comprehensive ROS otherwise negative. Physical Exam:  There were no vitals filed for this visit. Fetal Heart Rate (bpm): 176 Uterus:     System: General: well-developed, well-nourished female in no acute distress   Breasts:  normal appearance, no masses or tenderness bilaterally   Skin: normal coloration and turgor, no rashes   Neurologic: oriented, normal, negative, normal mood   Extremities: normal strength, tone, and muscle mass, ROM of all joints is normal   HEENT PERRLA, extraocular movement intact and sclera clear, anicteric   Mouth/Teeth mucous membranes moist, pharynx normal without lesions and dental hygiene good   Neck supple and no masses   Cardiovascular: regular rate and rhythm   Respiratory:  no respiratory distress, normal breath sounds   Abdomen: soft, non-tender; bowel sounds normal; no masses,  no organomegaly     Assessment:    Pregnancy: G2P1001 Patient Active Problem List   Diagnosis Date Noted   Supervision of other normal pregnancy, antepartum 09/08/2021   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 Supervision of other normal pregnancy, antepartum  - Culture, OB Urine - GC/Chlamydia probe amp ([facility])not at [facility] - Obstetric panel - HIV antibody (with reflex) - Hepatitis C Antibody - Enroll Patient in PreNatal Babyscripts - [place] bedside; Future  2. Pseudoseizure   Initial labs drawn. Continue prenatal vitamins. Genetic Screening discussed, NIPS: requested. Ultrasound discussed; fetal anatomic survey: requested. Problem list reviewed and updated. The nature of [place] with multiple MDs and other Advanced Practice Providers was explained to patient; also emphasized that residents, students are part of our team. Routine obstetric precautions  reviewed. Return 12 weeks for lab work, then 4 weeks for OB visit. Please have her sign consent for Pap results.Gina Pearson, Gina Pearson for [employer], [place]

## 2021-09-08 NOTE — Progress Notes (Signed)
Had spotting last week- none since Pt states she has painful cramping Pt requests NIPs Pt states she had pap smear in 2022 and it was normal- I don't see it in the records

## 2021-09-10 LAB — CULTURE, OB URINE

## 2021-09-10 LAB — URINE CULTURE, OB REFLEX: Organism ID, Bacteria: NO GROWTH

## 2021-09-11 LAB — GC/CHLAMYDIA PROBE AMP (~~LOC~~) NOT AT ARMC
Chlamydia: NEGATIVE
Comment: NEGATIVE
Comment: NORMAL
Neisseria Gonorrhea: NEGATIVE

## 2021-09-22 ENCOUNTER — Other Ambulatory Visit: Payer: Self-pay

## 2021-09-22 ENCOUNTER — Ambulatory Visit (INDEPENDENT_AMBULATORY_CARE_PROVIDER_SITE_OTHER): Payer: No Typology Code available for payment source

## 2021-09-22 DIAGNOSIS — Z3480 Encounter for supervision of other normal pregnancy, unspecified trimester: Secondary | ICD-10-CM

## 2021-09-22 DIAGNOSIS — Z3481 Encounter for supervision of other normal pregnancy, first trimester: Secondary | ICD-10-CM | POA: Diagnosis not present

## 2021-09-22 DIAGNOSIS — Z3A12 12 weeks gestation of pregnancy: Secondary | ICD-10-CM

## 2021-09-22 NOTE — Progress Notes (Addendum)
Pt here for NOB labs and Panorama/Horizon. Blood drawn. Pt is aware we will contact her with results.

## 2021-09-26 LAB — HEPATITIS C ANTIBODY
Hepatitis C Ab: NONREACTIVE
SIGNAL TO CUT-OFF: 0.1 (ref <1.00)

## 2021-09-26 LAB — OBSTETRIC PANEL
Absolute Monocytes: 239 cells/uL (ref 200–950)
Antibody Screen: POSITIVE — AB
Basophils Absolute: 29 cells/uL (ref 0–200)
Basophils Relative: 0.5 %
Eosinophils Absolute: 234 cells/uL (ref 15–500)
Eosinophils Relative: 4.1 %
HCT: 33.7 % — ABNORMAL LOW (ref 35.0–45.0)
Hemoglobin: 11 g/dL — ABNORMAL LOW (ref 11.7–15.5)
Hepatitis B Surface Ag: NONREACTIVE
Lymphs Abs: 2138 cells/uL (ref 850–3900)
MCH: 27.5 pg (ref 27.0–33.0)
MCHC: 32.6 g/dL (ref 32.0–36.0)
MCV: 84.3 fL (ref 80.0–100.0)
MPV: 10.1 fL (ref 7.5–12.5)
Monocytes Relative: 4.2 %
Neutro Abs: 3061 cells/uL (ref 1500–7800)
Neutrophils Relative %: 53.7 %
Platelets: 346 10*3/uL (ref 140–400)
RBC: 4 10*6/uL (ref 3.80–5.10)
RDW: 14 % (ref 11.0–15.0)
RPR Ser Ql: NONREACTIVE
Rubella: 9.96 Index
Total Lymphocyte: 37.5 %
WBC: 5.7 10*3/uL (ref 3.8–10.8)

## 2021-09-26 LAB — HIV ANTIBODY (ROUTINE TESTING W REFLEX): HIV 1&2 Ab, 4th Generation: NONREACTIVE

## 2021-09-26 LAB — ANTIBODY ID, TITER, AND TYPING, RBC

## 2021-10-02 ENCOUNTER — Encounter: Payer: Self-pay | Admitting: *Deleted

## 2021-10-02 DIAGNOSIS — Z3480 Encounter for supervision of other normal pregnancy, unspecified trimester: Secondary | ICD-10-CM

## 2021-10-03 ENCOUNTER — Telehealth: Payer: Self-pay | Admitting: *Deleted

## 2021-10-03 MED ORDER — ONDANSETRON HCL 4 MG PO TABS: 4.0000 mg | ORAL_TABLET | Freq: Three times a day (TID) | ORAL | 0 refills | Status: DC | PRN

## 2021-10-03 NOTE — Telephone Encounter (Cosign Needed)
Pt called stating that she spoke with j Rasch,NP at her last appt about Zofran for her nausea.  She is now requesting a RX for that to be sent to [facility] in [place].

## 2021-10-04 ENCOUNTER — Encounter: Payer: Self-pay | Admitting: *Deleted

## 2021-10-04 NOTE — Progress Notes (Signed)
Records scanned under media tab and forwarded to Dr leggett.

## 2021-10-05 ENCOUNTER — Other Ambulatory Visit: Payer: Self-pay

## 2021-10-05 ENCOUNTER — Ambulatory Visit (INDEPENDENT_AMBULATORY_CARE_PROVIDER_SITE_OTHER): Payer: Non-veteran care | Admitting: Obstetrics & Gynecology

## 2021-10-05 VITALS — BP 116/67 | HR 74 | Wt 128.0 lb

## 2021-10-05 DIAGNOSIS — F445 Conversion disorder with seizures or convulsions: Secondary | ICD-10-CM

## 2021-10-05 DIAGNOSIS — Z3480 Encounter for supervision of other normal pregnancy, unspecified trimester: Secondary | ICD-10-CM

## 2021-10-05 DIAGNOSIS — N390 Urinary tract infection, site not specified: Secondary | ICD-10-CM | POA: Insufficient documentation

## 2021-10-05 DIAGNOSIS — K649 Unspecified hemorrhoids: Secondary | ICD-10-CM | POA: Insufficient documentation

## 2021-10-05 DIAGNOSIS — N9411 Superficial (introital) dyspareunia: Secondary | ICD-10-CM | POA: Insufficient documentation

## 2021-10-05 DIAGNOSIS — G43909 Migraine, unspecified, not intractable, without status migrainosus: Secondary | ICD-10-CM | POA: Insufficient documentation

## 2021-10-05 DIAGNOSIS — Z6791 Unspecified blood type, Rh negative: Secondary | ICD-10-CM

## 2021-10-05 DIAGNOSIS — Z98891 History of uterine scar from previous surgery: Secondary | ICD-10-CM | POA: Insufficient documentation

## 2021-10-05 DIAGNOSIS — J309 Allergic rhinitis, unspecified: Secondary | ICD-10-CM | POA: Insufficient documentation

## 2021-10-05 DIAGNOSIS — R87612 Low grade squamous intraepithelial lesion on cytologic smear of cervix (LGSIL): Secondary | ICD-10-CM
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
## 2021-10-09 ENCOUNTER — Other Ambulatory Visit: Payer: Self-pay

## 2021-10-09 DIAGNOSIS — D563 Thalassemia minor: Secondary | ICD-10-CM

## 2021-10-09 DIAGNOSIS — Z141 Cystic fibrosis carrier: Secondary | ICD-10-CM

## 2021-10-09 NOTE — Progress Notes (Signed)
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 Pt notified through [place]

## 2021-10-12 ENCOUNTER — Encounter: Payer: Self-pay | Admitting: *Deleted

## 2021-10-19 ENCOUNTER — Other Ambulatory Visit: Payer: Self-pay

## 2021-10-19 ENCOUNTER — Other Ambulatory Visit: Payer: Self-pay | Admitting: Obstetrics & Gynecology

## 2021-10-19 DIAGNOSIS — G43809 Other migraine, not intractable, without status migrainosus: Secondary | ICD-10-CM

## 2021-10-19 MED ORDER — BUTALBITAL-APAP-CAFFEINE 50-325-40 MG PO TABS: ORAL_TABLET | ORAL | 0 refills | Status: AC

## 2021-10-19 NOTE — Progress Notes (Signed)
Pt has migraines prior to pregnancy.  Fioricet has helped in the past.  Rx given and should limit to 5 days a month.  Pt needs referral to headche center at [facility].  ?

## 2021-10-19 NOTE — Progress Notes (Signed)
Ref to headache specialist ordered per Dr.Leggett ?

## 2021-11-02 ENCOUNTER — Ambulatory Visit (INDEPENDENT_AMBULATORY_CARE_PROVIDER_SITE_OTHER): Admitting: Obstetrics and Gynecology

## 2021-11-02 ENCOUNTER — Other Ambulatory Visit: Payer: Self-pay

## 2021-11-02 ENCOUNTER — Encounter: Payer: Self-pay | Admitting: Obstetrics and Gynecology

## 2021-11-02 VITALS — BP 113/75 | HR 80 | Wt 131.0 lb

## 2021-11-02 MED ORDER — PROMETHAZINE HCL 25 MG PO TABS: 25.0000 mg | ORAL_TABLET | Freq: Four times a day (QID) | ORAL | 2 refills | Status: DC | PRN

## 2021-11-02 NOTE — Addendum Note (Signed)
Addended by: Mora Bellman on: 11/02/2021 02:48 PM ? ? Modules accepted: Orders ? ?

## 2021-11-02 NOTE — Progress Notes (Signed)
? ?  PRENATAL VISIT NOTE ? ?Subjective:  ?Gina Pearson is a 26 y.o. G2P1001 at 65w6dbeing seen today for ongoing prenatal care.  She is currently monitored for the following issues for this low-risk pregnancy and has Supervision of other normal pregnancy, antepartum; Pseudoseizure; Blood type, Rh negative; Allergic rhinitis, unspecified; Borderline personality disorder ([place]; Depressive disorder; Recurrent urinary tract infection; Acute stress reaction; Superficial (introital) dyspareunia; Ganglion, left wrist; Hemorrhoid; Low grade squamous intraepithelial lesion on cytologic smear of cervix (LGSIL); Migraine, unspecified, not intractable, without status migrainosus; Mixed emotional features as adjustment reaction; 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 ? ?Patient reports no complaints.  Contractions: Not present. Vag. Bleeding: None.  Movement: Present. Denies leaking of fluid.  ? ?The following portions of the patient's history were reviewed and updated as appropriate: allergies, current medications, past family history, past medical history, past social history, past surgical history and problem list.  ? ?Objective:  ? ?Vitals:  ? 11/02/21 1043  ?BP: 113/75  ?Pulse: 80  ?Weight: 131 lb (59.4 kg)  ? ? ?Fetal Status: Fetal Heart Rate (bpm): 139   Movement: Present    ? ?General:  Alert, oriented and cooperative. Patient is in no acute distress.  ?Skin: Skin is warm and dry. No rash noted.   ?Cardiovascular: Normal heart rate noted  ?Respiratory: Normal respiratory effort, no problems with respiration noted  ?Abdomen: Soft, gravid, appropriate for gestational age.  Pain/Pressure: Absent     ?Pelvic: Cervical exam deferred        ?Extremities: Normal range of motion.  Edema: None  ?Mental Status: Normal mood and affect. Normal behavior. Normal judgment and thought content.  ? ?Assessment and Plan:  ?Pregnancy: G2P1001 at 140w6d1.  Supervision of other normal pregnancy, antepartum ?Patient is doing well without complaints ?AFP today ?Anatomy ultrasound scheduled ?- Alpha fetoprotein, maternal ? ?2. History of cesarean section ?Information on TOLAC vs RCS provided ? ?3. Blood type, Rh negative ?Rhogam next visit ? ?Preterm labor symptoms and general obstetric precautions including but not limited to vaginal bleeding, contractions, leaking of fluid and fetal movement were reviewed in detail with the patient. ?Please refer to After Visit Summary for other counseling recommendations.  ? ?Return in about 4 weeks (around 11/30/2021) for in person, ROB, Low risk. ? ?Future Appointments  ?Date Time Provider [place]?11/13/2021  2:45 PM [facility] US4 [facility]  ? ? ?PeMora BellmanMD ? ?

## 2021-11-06 LAB — ALPHA FETOPROTEIN, MATERNAL
AFP MoM: 0.83
AFP, Serum: 43.4 ng/mL
Calc'd Gestational Age: 17.9 wk
Maternal Wt: 131 [lb_av]
Risk for ONTD: <1
Twins-AFP: 1

## 2021-11-13 ENCOUNTER — Ambulatory Visit: Payer: No Typology Code available for payment source | Attending: Obstetrics & Gynecology

## 2021-11-13 ENCOUNTER — Other Ambulatory Visit: Payer: Self-pay

## 2021-11-13 DIAGNOSIS — Z148 Genetic carrier of other disease: Secondary | ICD-10-CM | POA: Insufficient documentation

## 2021-11-13 DIAGNOSIS — Z3480 Encounter for supervision of other normal pregnancy, unspecified trimester: Secondary | ICD-10-CM | POA: Diagnosis not present

## 2021-11-13 DIAGNOSIS — Z363 Encounter for antenatal screening for malformations: Secondary | ICD-10-CM | POA: Diagnosis present

## 2021-11-13 DIAGNOSIS — O09892 Supervision of other high risk pregnancies, second trimester: Secondary | ICD-10-CM | POA: Diagnosis not present

## 2021-11-13 DIAGNOSIS — Z3A19 19 weeks gestation of pregnancy: Secondary | ICD-10-CM | POA: Insufficient documentation

## 2021-11-13 IMAGING — US US MFM OB COMP +14 WKS
1 series · 13 of 28 positions shown · non-contrast
Comparison: none

[Series 1: us mfm ob comp +14 wks · 13 of 114 slices shown]
[im 5/114]
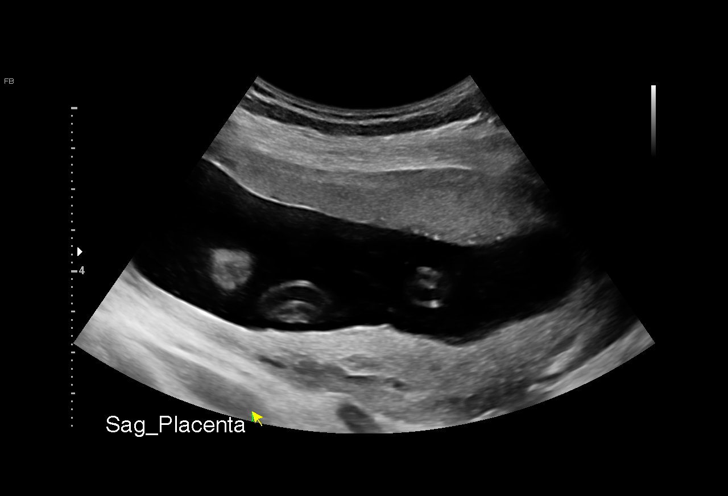
[im 13/114]
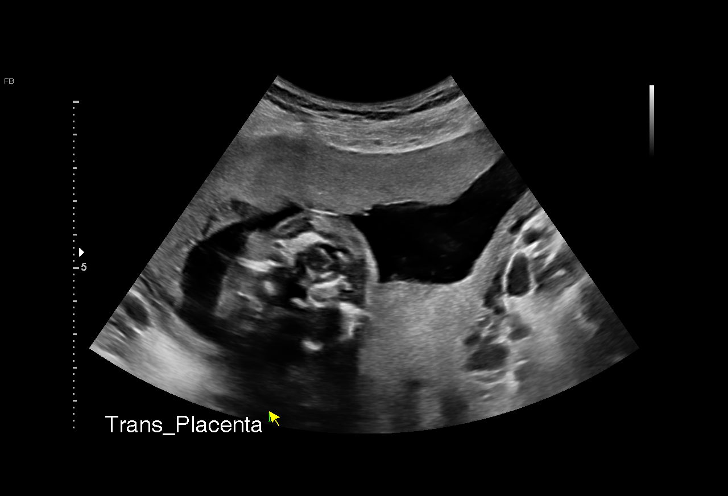
[im 21/114]
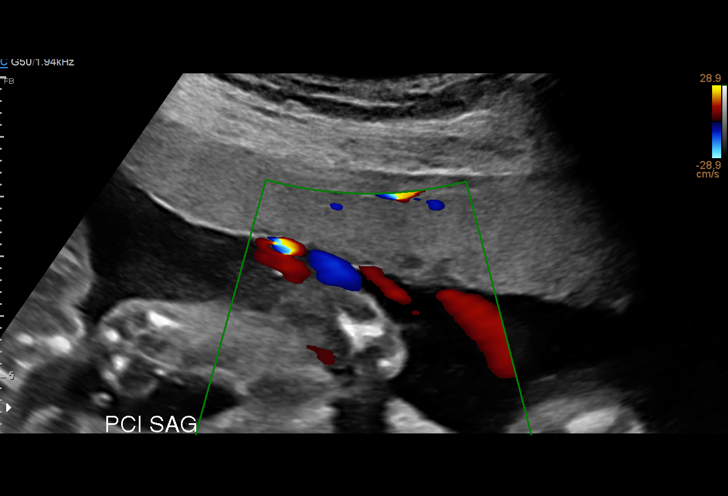
[im 30/114]
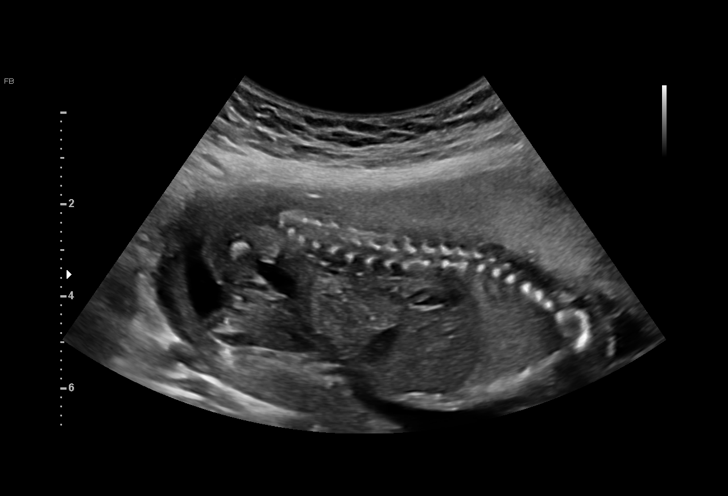
[im 38/114]
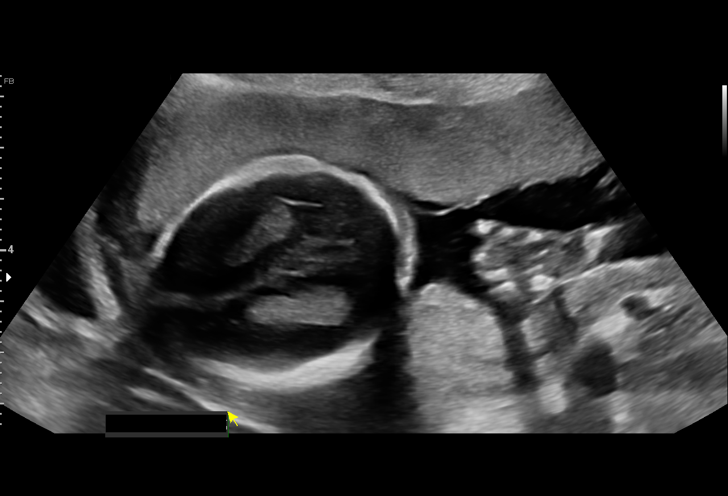
[im 47/114]
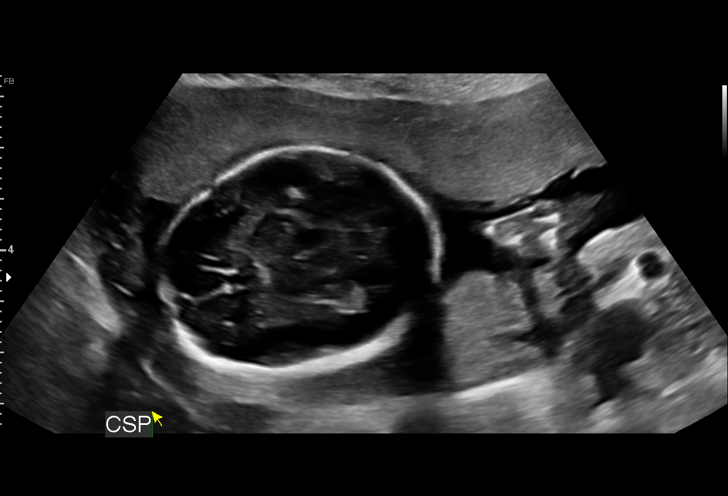
[im 59/114]
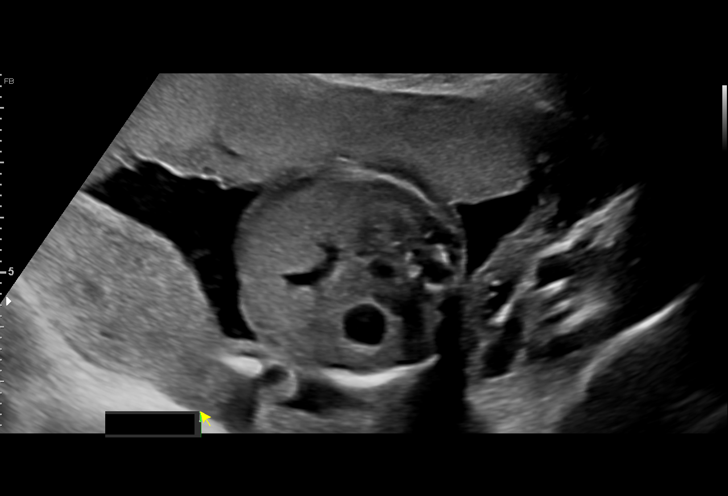
[im 67/114]
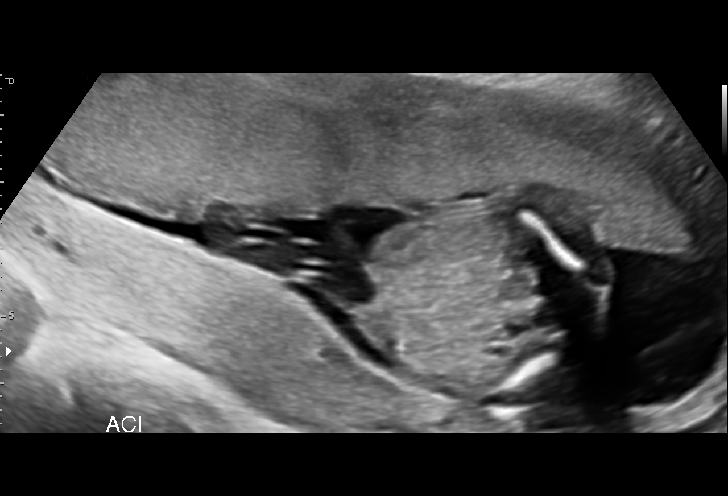
[im 76/114]
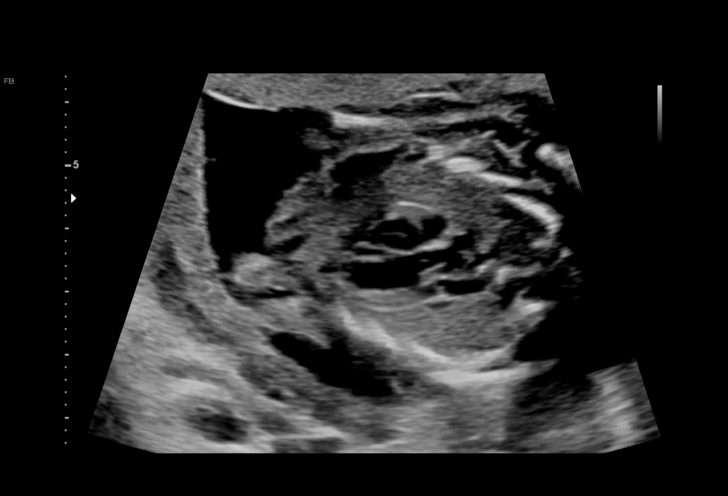
[im 84/114]
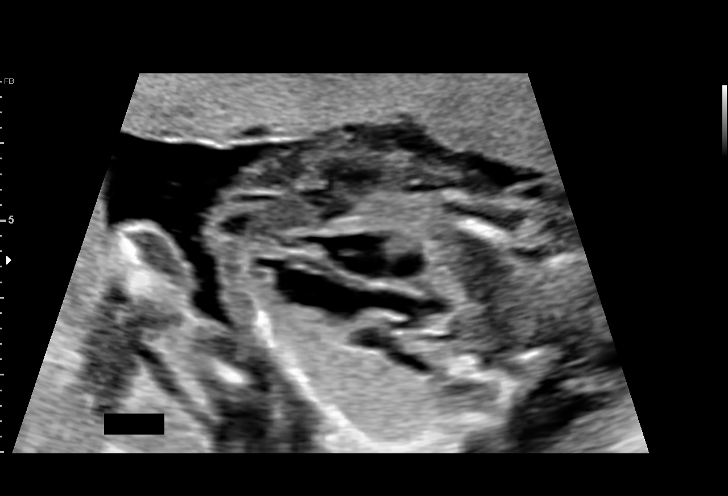
[im 93/114]
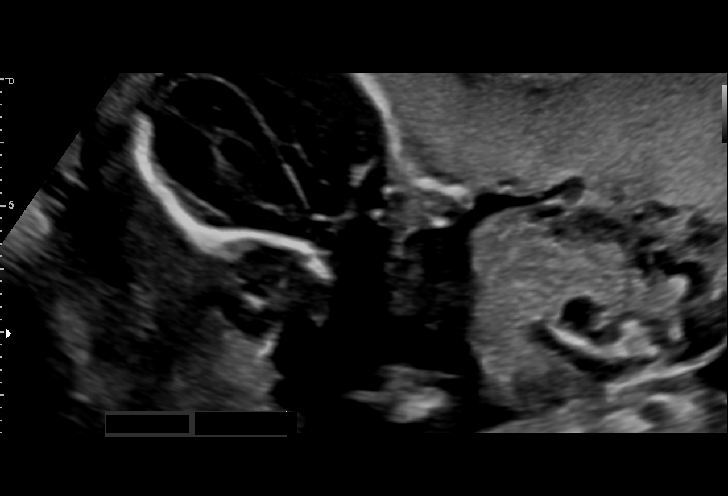
[im 101/114]
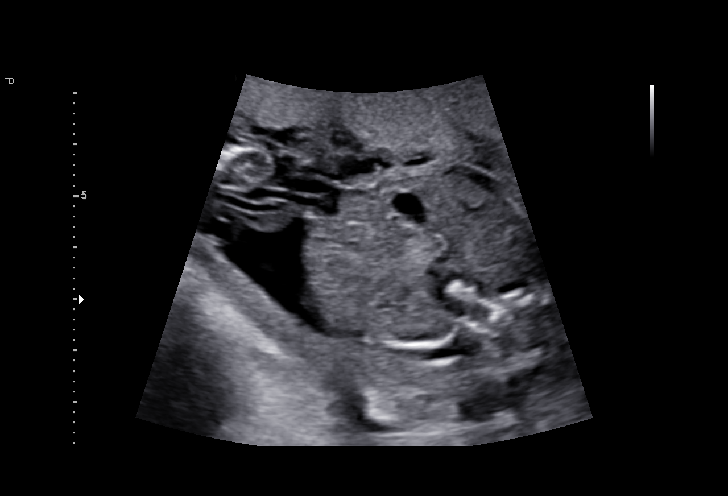
[im 109/114]
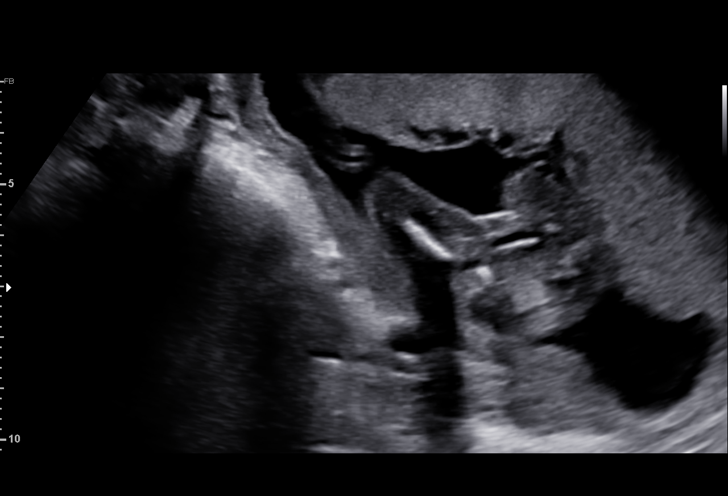

[13 of 28 positions shown; findings below may reference images not displayed]

1  US MFM OB COMP + 14 WK                76805.01    EZMARAY LEONARDI

Indications

 Cystic Fibrosis (CF) Carrier, second trimester
 Encounter for antenatal screening for
 malformations
 Genetic carrier ([place])
 LR Nips/Neg AFP
 19 weeks gestation of pregnancy
Fetal Evaluation

 Num Of Fetuses:         1
 Fetal Heart Rate(bpm):  152
 Cardiac Activity:       Observed
 Presentation:           Cephalic
 Placenta:               Anterior
 P. Cord Insertion:      Visualized, central

 Amniotic Fluid
 AFI FV:      Within normal limits

                             Largest Pocket(cm)

Biometry

 BPD:      43.1  mm     G. Age:  19w 0d         33  %    CI:        73.77   %    70 - 86
                                                         FL/HC:      18.3   %    16.1 -
 HC:      159.4  mm     G. Age:  18w 5d         15  %    HC/AC:      1.19        1.09 -
 AC:      133.7  mm     G. Age:  18w 6d         26  %    FL/BPD:     67.5   %
 FL:       29.1  mm     G. Age:  19w 0d         26  %    FL/AC:      21.8   %    20 - 24
 HUM:      29.6  mm     G. Age:  19w 5d         58  %
 CER:      19.4  mm     G. Age:  18w 6d         33  %
 NFT:       4.8  mm
 LV:        5.8  mm
 CM:        6.3  mm

 Est. FW:     263  gm      0 lb 9 oz     19  %
OB History

 Gravidity:    2         Term:   1
 Living:       1
Gestational Age

 LMP:           19w 3d        Date:  06/30/21                 EDD:   04/06/22
 U/S Today:     18w 6d                                        EDD:   04/10/22
 Best:          19w 3d     Det. By:  LMP  (06/30/21)          EDD:   04/06/22
Anatomy

 Cranium:               Appears normal         LVOT:                   Not well visualized
 Cavum:                 Appears normal         Aortic Arch:            Appears normal
 Ventricles:            Appears normal         Ductal Arch:            Not well visualized
 Choroid Plexus:        Appears normal         Diaphragm:              Appears normal
 Cerebellum:            Appears normal         Stomach:                Appears normal, left
                                                                       sided
 Posterior Fossa:       Appears normal         Abdomen:                Appears normal
 Nuchal Fold:           Appears normal         Abdominal Wall:         Appears nml (cord
                                                                       insert, abd wall)
 Face:                  Appears normal         Cord Vessels:           Appears normal (3
                        (orbits and profile)                           vessel cord)
 Lips:                  Appears normal         Kidneys:                Appear normal
 Palate:                Not well visualized    Bladder:                Appears normal
 Thoracic:              Appears normal         Spine:                  Appears normal
 Heart:                 Appears normal         Upper Extremities:      Appears normal
                        (4CH, axis, and
                        situs)
 RVOT:                  Appears normal         Lower Extremities:      Appears normal

 Other:  Fetus appears to be female. Nasal bone visualized. Hands and feet
         visualized. Lenses visualized. 3VV visualized. Technically difficult due
         to fetal position.
Cervix Uterus Adnexa

 Cervix
 Length:           3.09  cm.
 Normal appearance by transabdominal scan.

 Adnexa
 No abnormality visualized.
Comments

 This patient was seen for a detailed fetal anatomy scan.
 She denies any significant past medical history and denies
 any problems in her current pregnancy.
 She had a cell free DNA test earlier in her pregnancy which
 indicated a low risk for trisomy 21, 18, and 13. A female fetus
 is predicted.
 She was informed that the fetal growth and amniotic fluid
 level were appropriate for her gestational age.
 There were no obvious fetal anomalies noted on today's
 ultrasound exam.  However, the views of the fetal anatomy
 were limited today due to the fetal position.
 The patient was informed that anomalies may be missed due
 to technical limitations. If the fetus is in a suboptimal position
 or maternal habitus is increased, visualization of the fetus in
 the maternal uterus may be impaired.
 A follow-up exam was scheduled in 4 weeks to complete the
 views of the fetal anatomy.

## 2021-11-14 ENCOUNTER — Other Ambulatory Visit: Payer: Self-pay | Admitting: *Deleted

## 2021-11-14 DIAGNOSIS — Z362 Encounter for other antenatal screening follow-up: Secondary | ICD-10-CM

## 2021-11-28 ENCOUNTER — Ambulatory Visit (INDEPENDENT_AMBULATORY_CARE_PROVIDER_SITE_OTHER)

## 2021-11-28 VITALS — BP 113/71 | HR 87 | Wt 135.0 lb

## 2021-11-28 DIAGNOSIS — Z98891 History of uterine scar from previous surgery: Secondary | ICD-10-CM

## 2021-11-28 DIAGNOSIS — R002 Palpitations: Secondary | ICD-10-CM

## 2021-11-28 DIAGNOSIS — Z6791 Unspecified blood type, Rh negative: Secondary | ICD-10-CM

## 2021-11-28 DIAGNOSIS — Z3480 Encounter for supervision of other normal pregnancy, unspecified trimester: Secondary | ICD-10-CM

## 2021-11-28 NOTE — Progress Notes (Signed)
? ?  PRENATAL VISIT NOTE ? ?Subjective:  ?Gina Pearson is a 26 y.o. G2P1001 at 68w4dbeing seen today for ongoing prenatal care.  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 ? ?Patient reports episodes of dizziness and palpitations. This has been ongoing for several weeks, but worsened in the past week. Symptoms occur at least once daily. Symptoms worse when standing for long periods and cooking. Feels like heart is beating fast and out of chest. She denies chest pain or shortness of breath. She feels like her appetite has improved and she is eating more/gaining weight. She has no symptoms today.  Contractions: Not present. Vag. Bleeding: None.  Movement: Present. Denies leaking of fluid.  ? ?The following portions of the patient's history were reviewed and updated as appropriate: allergies, current medications, past family history, past medical history, past social history, past surgical history and problem list.  ? ?Objective:  ? ?Vitals:  ? 11/28/21 1337  ?BP: 113/71  ?Pulse: 87  ?Weight: 135 lb (61.2 kg)  ? ? ?Fetal Status: Fetal Heart Rate (bpm): 145   Movement: Present    ? ?General:  Alert, oriented and cooperative. Patient is in no acute distress.  ?Skin: Skin is warm and dry. No rash noted.   ?Cardiovascular: Normal heart rate noted   ?Respiratory: Normal respiratory effort, no problems with respiration noted  ?Abdomen: Soft, gravid, appropriate for gestational age.  Pain/Pressure: Absent     ?Pelvic: Cervical exam deferred        ?Extremities: Normal range of motion.  Edema: None  ?Mental Status: Normal mood and affect. Normal behavior. Normal judgment and thought content.  ? ?Assessment and Plan:  ?Pregnancy: G2P1001 at 228w4d1. Supervision of other normal pregnancy, antepartum ?- Routine OB ?- Anticipatory guidance for upcoming appointments provided ? ?2. History of cesarean section ?- Desires TOLAC. Will have patient meet with MD to sign consent form at later visit ? ?3. Blood type, Rh negative ?- No bleeding. Plan rhogam at 28 weeks ? ?4. Palpitations ?- Cardio referral placed ? ?- AMB Referral to [facility] ? ?Preterm labor symptoms and general obstetric precautions including but not limited to vaginal bleeding, contractions, leaking of fluid and fetal movement were reviewed in detail with the patient. ?Please refer to After Visit Summary for other counseling recommendations.  ? ?Return in about 4 weeks (around 12/26/2021). ? ?Future Appointments  ?Date Time Provider [place]?12/12/2021  2:45 PM [facility] NURSE [facility]  ?12/12/2021  3:00 PM [facility] US1 [facility] [place]?12/26/2021  1:50 PM SiRenee HarderCNM [facility]  ? ? ?DaRenee HarderCNM ? ?

## 2021-12-01 ENCOUNTER — Ambulatory Visit: Admitting: Cardiology

## 2021-12-12 ENCOUNTER — Ambulatory Visit: Admitting: *Deleted

## 2021-12-12 ENCOUNTER — Other Ambulatory Visit: Payer: Self-pay | Admitting: Obstetrics

## 2021-12-12 ENCOUNTER — Ambulatory Visit: Attending: Obstetrics

## 2021-12-12 VITALS — BP 104/55 | HR 82

## 2021-12-12 DIAGNOSIS — Z362 Encounter for other antenatal screening follow-up: Secondary | ICD-10-CM | POA: Insufficient documentation

## 2021-12-12 DIAGNOSIS — O09892 Supervision of other high risk pregnancies, second trimester: Secondary | ICD-10-CM | POA: Insufficient documentation

## 2021-12-12 DIAGNOSIS — O365920 Maternal care for other known or suspected poor fetal growth, second trimester, not applicable or unspecified: Secondary | ICD-10-CM | POA: Diagnosis present

## 2021-12-12 DIAGNOSIS — Z6791 Unspecified blood type, Rh negative: Secondary | ICD-10-CM

## 2021-12-12 DIAGNOSIS — O285 Abnormal chromosomal and genetic finding on antenatal screening of mother: Secondary | ICD-10-CM

## 2021-12-12 DIAGNOSIS — D573 Sickle-cell trait: Secondary | ICD-10-CM

## 2021-12-12 DIAGNOSIS — Z3480 Encounter for supervision of other normal pregnancy, unspecified trimester: Secondary | ICD-10-CM | POA: Insufficient documentation

## 2021-12-12 DIAGNOSIS — Z3A23 23 weeks gestation of pregnancy: Secondary | ICD-10-CM | POA: Diagnosis not present

## 2021-12-12 DIAGNOSIS — D563 Thalassemia minor: Secondary | ICD-10-CM

## 2021-12-12 DIAGNOSIS — Z148 Genetic carrier of other disease: Secondary | ICD-10-CM | POA: Insufficient documentation

## 2021-12-13 ENCOUNTER — Other Ambulatory Visit: Payer: Self-pay | Admitting: *Deleted

## 2021-12-13 DIAGNOSIS — O34219 Maternal care for unspecified type scar from previous cesarean delivery: Secondary | ICD-10-CM

## 2021-12-13 DIAGNOSIS — O09892 Supervision of other high risk pregnancies, second trimester: Secondary | ICD-10-CM

## 2021-12-13 DIAGNOSIS — O365920 Maternal care for other known or suspected poor fetal growth, second trimester, not applicable or unspecified: Secondary | ICD-10-CM

## 2021-12-26 ENCOUNTER — Ambulatory Visit (INDEPENDENT_AMBULATORY_CARE_PROVIDER_SITE_OTHER)

## 2021-12-26 VITALS — BP 113/67 | HR 93 | Wt 137.0 lb

## 2021-12-26 DIAGNOSIS — O365920 Maternal care for other known or suspected poor fetal growth, second trimester, not applicable or unspecified: Secondary | ICD-10-CM

## 2021-12-26 DIAGNOSIS — O0992 Supervision of high risk pregnancy, unspecified, second trimester: Secondary | ICD-10-CM

## 2021-12-26 DIAGNOSIS — Z98891 History of uterine scar from previous surgery: Secondary | ICD-10-CM

## 2021-12-26 DIAGNOSIS — Z6791 Unspecified blood type, Rh negative: Secondary | ICD-10-CM

## 2021-12-26 DIAGNOSIS — Z3A25 25 weeks gestation of pregnancy: Secondary | ICD-10-CM

## 2021-12-26 NOTE — Progress Notes (Signed)
? ?  PRENATAL VISIT NOTE ? ?Subjective:  ?Gina Pearson is a 26 y.o. G2P1001 at 74w4dbeing seen today for ongoing prenatal care.  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 ? ?Patient reports pelvic pain with walking/standing. Has also experienced intermittent braxton hicks contractions which resolve with rest and warm showers.  Contractions: Irritability. Vag. Bleeding: None.  Movement: Present. Denies leaking of fluid.  ? ?The following portions of the patient's history were reviewed and updated as appropriate: allergies, current medications, past family history, past medical history, past social history, past surgical history and problem list.  ? ?Objective:  ? ?Vitals:  ? 12/26/21 1400  ?BP: 113/67  ?Pulse: 93  ?Weight: 137 lb (62.1 kg)  ? ? ?Fetal Status: Fetal Heart Rate (bpm): 137 Fundal Height: 22 cm Movement: Present    ? ?General:  Alert, oriented and cooperative. Patient is in no acute distress.  ?Skin: Skin is warm and dry. No rash noted.   ?Cardiovascular: Normal heart rate noted  ?Respiratory: Normal respiratory effort, no problems with respiration noted  ?Abdomen: Soft, gravid, appropriate for gestational age.  Pain/Pressure: Absent     ?Pelvic: Cervical exam deferred        ?Extremities: Normal range of motion.  Edema: None  ?Mental  Status: Normal mood and affect. Normal behavior. Normal judgment and thought content.  ? ?Assessment and Plan:  ?Pregnancy: G2P1001 at 228w4d1. Supervision of high risk pregnancy, antepartum, second trimester ?- Routine OB. Doing well.  ?- Reassurance provided regarding pelvic pressure and braxton hicks. Continue use of warm showers, increase water intake, and recommend support belt. Reviewed PTL precautions ?- Anticipatory guidance for upcoming appointments reviewed. GTT and labs next visit ? ?2. [redacted] weeks gestation of pregnancy ?-- Endorses active fetal movement ? ?3. History of cesarean section ?- Desires TOLAC. Sign consent with MD at future appointment ? ?4. Blood type, Rh negative ?- No bleeding ?- Rhogam next visit ? ?5. Encounter for maternal care for suspected poor fetal growth in singleton pregnancy in second trimester ?- FH 22cm today ?- Reviewed ultrasound from 4/25, EFW 495g, 5%, normal dopplers ?- Has follow up ultrasound/dopplers on 5/23 ? ? ?Preterm labor symptoms and general obstetric precautions including but not limited to vaginal bleeding, contractions, leaking of fluid and fetal movement were reviewed in detail with the patient. ?Please refer to After Visit Summary for other counseling recommendations.  ? ?Return in about 3 weeks (around 01/16/2022). ? ?Future Appointments  ?Date Time Provider [place]?12/29/2021  1:40 PM PeJohney FrameHeGreer EeMD [facility] None  ?01/05/2022  9:00 AM [facility]  ?01/09/2022  2:15 PM [facility] NURSE [facility]  ?01/09/2022  2:30 PM [facility] US3 [facility]  ?01/16/2022  2:15 PM [facility] NURSE [facility]  ?01/16/2022  2:30 PM [facility] US3 [facility]  ?01/18/2022  2:50 PM DuRadene GunningMD [facility]  ?01/19/2022 11:00 AM Te[place] ClBobbye MortonPA-C [facility]  ? ? ?DaRenee HarderCNM ? ?

## 2021-12-28 NOTE — Progress Notes (Deleted)
?[facility] ? ?New Evaluation ? ?Date:  12/28/2021  ? ?ID:  Gina Pearson, DOB 09/14/1995, MRN [id number] ? ?PCP:  [facility] ?  ?[facility] Providers ?Cardiologist:  None  ?Electrophysiologist:  None      ? ?Referring MD: [facility]  ? ?Chief Complaint: Dizziness and palpitations ? ?History of Present Illness:   ? ?Gina Pearson is a 26 y.o. female [G2P1001] who is being seen today for the evaluation of dizziness and palpitations at the request of [facility].  ? ?Today *** ? ? ?Prior CV Studies Reviewed: ?The following studies were reviewed today: ?*** ? ?Past Medical History:  ?Diagnosis Date  ? Anal fissure   ? Anal fissure   ? Anemia   ? Hiatal hernia   ? Migraines   ? PTSD (post-traumatic stress disorder)   ? Seizures ([place])   ? Uterine fibroids affecting pregnancy in second trimester   ? ? ?Past Surgical History:  ?Procedure Laterality Date  ? CESAREAN SECTION    ? ERCP W/ SPHICTEROTOMY    ? SPHINCTEROTOMY    ?   ? ?OB History   ? ? Gravida  ?2  ? Para  ?1  ? Term  ?1  ? Preterm  ?   ? AB  ?   ? Living  ?1  ?  ? ? SAB  ?   ? IAB  ?   ? Ectopic  ?   ? Multiple  ?   ? Live Births  ?   ?   ?  ?  ?    ? ? ?Current Medications: ?No outpatient medications have been marked as taking for the 12/29/21 encounter (Appointment) with Freada Bergeron, MD.  ?  ? ?Allergies:   Azithromycin, Ceftriaxone, Other, and Sumatriptan  ? ?Social History  ? ?Socioeconomic History  ? Marital status: Significant Other  ?  Spouse name: Not on file  ? Number of children: Not on file  ? Years of education: Not on file  ? Highest education level: Not on file  ?Occupational History  ? Not on file  ?Tobacco Use  ? Smoking status: Never  ? Smokeless tobacco: Never  ?Vaping Use  ? Vaping Use: Never used  ?Substance and Sexual Activity  ? Alcohol use: Not Currently  ?  Comment: not while preg  ? Drug use: Never  ? Sexual activity: Not Currently  ?  Birth control/protection: None   ?Other Topics Concern  ? Not on file  ?Social History Narrative  ? ** Merged History Encounter **  ?    ? ?Social Determinants of Health  ? ?Financial Resource Strain: Not on file  ?Food Insecurity: Not on file  ?Transportation Needs: Not on file  ?Physical Activity: Not on file  ?Stress: Not on file  ?Social Connections: Not on file  ?  ? ? ?Family History  ?Problem Relation Age of Onset  ? Hypertension Mother   ?   ? ?ROS:   ?Please see the history of present illness.    ?*** ?All other systems reviewed and are negative. ? ? ?Labs/EKG Reviewed:   ? ?EKG:   ?EKG is *** ordered today.  The ekg ordered today demonstrates *** ? ?Recent Labs: ?09/22/2021: Hemoglobin 11.0; Platelets 346  ? ?Recent Lipid Panel ?No results found for: CHOL, TRIG, HDL, CHOLHDL, LDLCALC, LDLDIRECT ? ?Physical Exam:   ? ?VS:  LMP 06/30/2021    ? ?Wt Readings from Last 3 Encounters:  ?12/26/21  137 lb (62.1 kg)  ?11/28/21 135 lb (61.2 kg)  ?11/02/21 131 lb (59.4 kg)  ?  ? ?GEN: *** Well nourished, well developed in no acute distress ?HEENT: Normal ?NECK: No JVD; No carotid bruits ?LYMPHATICS: No lymphadenopathy ?CARDIAC: ***RRR, no murmurs, rubs, gallops ?RESPIRATORY:  Clear to auscultation without rales, wheezing or rhonchi  ?ABDOMEN: Soft, non-tender, non-distended ?MUSCULOSKELETAL:  No edema; No deformity  ?SKIN: Warm and dry ?NEUROLOGIC:  Alert and oriented x 3 ?PSYCHIATRIC:  Normal affect  ? ? ?Risk Assessment/Risk Calculators:   ?{ ?Click to calculate CARPREG II - THEN refresh note :1}  ?  ?{ ?Click to caclulate Mod WHO Class of CV Risk - THEN refresh note :1}  ?   ?{ ?Click for [id number] Score - THEN Refresh Note    :[id number]}  ?  ? ? ?ASSESSMENT & PLAN:   ? ?#Dizziness: ?Common in pregnancy and most often related to orthostasis. Will check cardiac monitor and discussed importance of ensuring she has adequate hydration, small frequent meals, and slow position changes.  ?-Check zio ? ?#Palpitations: ?Patient reports ***. Common in  pregnancy and often due to increased hemodynamic demands. Will check cardiac monitor for further evaluation. ?-Check cardiac monitor ?-PO mag at night ?-Manage dizziness as above ? ?There are no Patient Instructions on file for this visit. ? ? ?Dispo:  No follow-ups on file.  ? ?Medication Adjustments/Labs and Tests Ordered: ?Current medicines are reviewed at length with the patient today.  Concerns regarding medicines are outlined above.  ?Tests Ordered: ?No orders of the defined types were placed in this encounter. ? ?Medication Changes: ?No orders of the defined types were placed in this encounter. ?  ?

## 2021-12-29 ENCOUNTER — Ambulatory Visit (INDEPENDENT_AMBULATORY_CARE_PROVIDER_SITE_OTHER)

## 2021-12-29 ENCOUNTER — Ambulatory Visit (INDEPENDENT_AMBULATORY_CARE_PROVIDER_SITE_OTHER): Admitting: Cardiology

## 2021-12-29 ENCOUNTER — Encounter: Payer: Self-pay | Admitting: Cardiology

## 2021-12-29 VITALS — BP 100/70 | HR 85 | Ht 64.0 in | Wt 137.0 lb

## 2021-12-29 DIAGNOSIS — R002 Palpitations: Secondary | ICD-10-CM | POA: Diagnosis not present

## 2021-12-29 DIAGNOSIS — R42 Dizziness and giddiness: Secondary | ICD-10-CM

## 2021-12-29 NOTE — Addendum Note (Signed)
Addended by: [name] on: 12/29/2021 02:57 PM ? ? Modules accepted: Level of Service ? ?

## 2021-12-29 NOTE — Patient Instructions (Signed)
Medication Instructions:  ? ?Your physician recommends that you continue on your current medications as directed. Please refer to the Current Medication list given to you today. ? ?*If you need a refill on your cardiac medications before your next appointment, please call your pharmacy* ? ? ?Testing/Procedures: ? ?ZIO XT- Long Term Monitor Instructions ? ?Your physician has requested you wear a ZIO patch monitor for 3 days.  ?This is a single patch monitor. [employer] supplies one patch monitor per enrollment. Additional ?stickers are not available. Please do not apply patch if you will be having a Nuclear Stress Test,  ?Echocardiogram, Cardiac CT, MRI, or Chest Xray during the period you would be wearing the  ?monitor. The patch cannot be worn during these tests. You cannot remove and re-apply the  ?ZIO XT patch monitor.  ?Your ZIO patch monitor will be mailed 3 day USPS to your address on file. It may take 3-5 days  ?to receive your monitor after you have been enrolled.  ?Once you have received your monitor, please review the enclosed instructions. Your monitor  ?has already been registered assigning a specific monitor serial # to you. ? ?Billing and Patient Assistance Program Information ? ?We have supplied [employer] with any of your insurance information on file for billing purposes. ?[employer] offers a sliding scale Patient Assistance Program for patients that do not have  ?insurance, or whose insurance does not completely cover the cost of the ZIO monitor.  ?You must apply for the Patient Assistance Program to qualify for this discounted rate.  ?To apply, please call [employer] at [phone], select option 4, select option 2, ask to apply for  ?Patient Assistance Program. Theodore Demark will ask your household income, and how many people  ?are in your household. They will quote your out-of-pocket cost based on that information.  ?[employer] will also be able to set up a 12-month interest-free payment plan if  needed. ? ?Applying the monitor ?  ?Shave hair from upper left chest.  ?Hold abrader disc by orange tab. Rub abrader in 40 strokes over the upper left chest as  ?indicated in your monitor instructions.  ?Clean area with 4 enclosed alcohol pads. Let dry.  ?Apply patch as indicated in monitor instructions. Patch will be placed under collarbone on left  ?side of chest with arrow pointing upward.  ?Rub patch adhesive wings for 2 minutes. Remove white label marked "1". Remove the white  ?label marked "2". Rub patch adhesive wings for 2 additional minutes.  ?While looking in a mirror, press and release button in center of patch. A small green light will  ?flash 3-4 times. This will be your only indicator that the monitor has been turned on.  ?Do not shower for the first 24 hours. You may shower after the first 24 hours.  ?Press the button if you feel a symptom. You will hear a small click. Record Date, Time and  ?Symptom in the Patient Logbook.  ?When you are ready to remove the patch, follow instructions on the last 2 pages of Patient  ?Logbook. Stick patch monitor onto the last page of Patient Logbook.  ?Place Patient Logbook in the blue and white box. Use locking tab on box and tape box closed  ?securely. The blue and white box has prepaid postage on it. Please place it in the mailbox as  ?soon as possible. Your physician should have your test results approximately 7 days after the  ?monitor has been [facility]  ?Call [employer]  Customer Care at [phone] if you have questions regarding  ?your ZIO XT patch monitor. Call them immediately if you see an orange light blinking on your  ?monitor.  ?If your monitor falls off in less than 4 days, contact our Monitor department at [phone].  ?If your monitor becomes loose or falls off after 4 days call [employer] at [phone] for  ?suggestions on securing your monitor ? ? ? ?Follow-Up: ? ?AS NEEDED WITH WITH DR. Johney Frame HERE AT [place] ? ?1} ? ?Other Instructions ? ? ? ?Important Information About [employer] ? ? ? ? ? ? ?

## 2021-12-29 NOTE — Progress Notes (Unsigned)
Enrolled patient for a 3 day Zio XT monitor to be mailed to patients home  

## 2021-12-29 NOTE — Progress Notes (Addendum)
?[facility] ? ?New Evaluation ? ?Date:  12/29/2021  ? ?ID:  Gina Pearson, DOB July 27, 1996, MRN [id number] ? ?PCP:  [facility] ?  ?[facility] Providers ?Cardiologist:  None  ?Electrophysiologist:  None      ? ?Referring MD: [facility]  ? ?Chief Complaint: Dizziness and palpitations ? ?History of Present Illness:   ? ?Gina Pearson is a 26 y.o. female [G2P1001] who is being seen today for the evaluation of dizziness and palpitations at the request of [facility].  ? ?Today patient is reporting that she has had an increase in her presyncopal episodes that are associated with dizziness, shortness of breath, and palpitations. The symptoms are more prevalent when she is standing for long periods of time. She notes that she had a syncopal event at 9 weeks but has only had presyncope since that time. Denies any chest pain but reports a "pressure" that occurs from her head and goes down towards her chest. Denies leg swelling, LOF, VB. Is having Braxton Hicks contractions and is feeling the baby move well.  ? ? ?Prior CV Studies Reviewed: ?No prior CV studies ? ?Past Medical History:  ?Diagnosis Date  ? Anal fissure   ? Anal fissure   ? Anemia   ? Hiatal hernia   ? Migraines   ? PTSD (post-traumatic stress disorder)   ? Seizures ([place])   ? Uterine fibroids affecting pregnancy in second trimester   ? ? ?Past Surgical History:  ?Procedure Laterality Date  ? CESAREAN SECTION    ? ERCP W/ SPHICTEROTOMY    ? SPHINCTEROTOMY    ?   ? ?OB History   ? ? Gravida  ?2  ? Para  ?1  ? Term  ?1  ? Preterm  ?   ? AB  ?   ? Living  ?1  ?  ? ? SAB  ?   ? IAB  ?   ? Ectopic  ?   ? Multiple  ?   ? Live Births  ?   ?   ?  ?  ?    ? ? ?Current Medications: ?Current Meds  ?Medication Sig  ? butalbital-acetaminophen-caffeine (FIORICET) 50-325-40 MG tablet Take 1 tablet every 6 hours for migraine headache not to exceed 5 days a month.  ? FIBER ADULT GUMMIES PO Take by mouth.  ? promethazine  (PHENERGAN) 25 MG tablet Take 1 tablet (25 mg total) by mouth every 6 (six) hours as needed for nausea or vomiting.  ?  ? ?Allergies:   Azithromycin, Ceftriaxone, Other, and Sumatriptan  ? ?Social History  ? ?Socioeconomic History  ? Marital status: Significant Other  ?  Spouse name: Not on file  ? Number of children: Not on file  ? Years of education: Not on file  ? Highest education level: Not on file  ?Occupational History  ? Not on file  ?Tobacco Use  ? Smoking status: Never  ? Smokeless tobacco: Never  ?Vaping Use  ? Vaping Use: Never used  ?Substance and Sexual Activity  ? Alcohol use: Not Currently  ?  Comment: not while preg  ? Drug use: Never  ? Sexual activity: Not Currently  ?  Birth control/protection: None  ?Other Topics Concern  ? Not on file  ?Social History Narrative  ? ** Merged History Encounter **  ?    ? ?Social Determinants of Health  ? ?Financial Resource Strain: Not on file  ?Food Insecurity: Not on file  ?  Transportation Needs: Not on file  ?Physical Activity: Not on file  ?Stress: Not on file  ?Social Connections: Not on file  ?  ? ? ?Family History  ?Problem Relation Age of Onset  ? Hypertension Mother   ?   ? ?ROS:   ?Please see the history of present illness.    ?All other systems reviewed and are negative. ? ? ?Labs/EKG Reviewed:   ? ?EKG:   ?EKG ordered today.  The ekg ordered today demonstrates NSR at 85 bpm ? ?Recent Labs: ?09/22/2021: Hemoglobin 11.0; Platelets 346  ? ?Recent Lipid Panel ?No results found for: CHOL, TRIG, HDL, CHOLHDL, LDLCALC, LDLDIRECT ? ?Physical Exam:   ? ?VS:  BP 100/70   Pulse 85   Ht '5\' 4"'$  (1.626 m)   Wt 137 lb (62.1 kg)   LMP 06/30/2021   SpO2 99%   BMI 23.52 kg/m?    ? ?Wt Readings from Last 3 Encounters:  ?12/29/21 137 lb (62.1 kg)  ?12/26/21 137 lb (62.1 kg)  ?11/28/21 135 lb (61.2 kg)  ?  ? ?GEN:  Well nourished, well developed in no acute distress ?HEENT: Normal ?NECK: No JVD; No carotid bruits ?CARDIAC: RRR, 6-8/3 systolic flow murmur ?RESPIRATORY:   Clear to auscultation without rales, wheezing or rhonchi  ?ABDOMEN: Gravid, soft ?MUSCULOSKELETAL:  No edema; No deformity  ?SKIN: Warm and dry ?NEUROLOGIC:  Alert and oriented x 3 ?PSYCHIATRIC:  Normal affect  ? ? ?Risk Assessment/Risk Calculators:   ? ? ?ASSESSMENT & PLAN:   ? ?#Dizziness: ?Common in pregnancy and most often related to orthostasis. Will check cardiac monitor and discussed importance of ensuring she has adequate hydration, small frequent meals, and slow position changes.  ?-Check zio x3 days ? ?#Palpitations: ?Patient reports palpitations with increased presyncopal episodes that occur with standing. Common in pregnancy and often due to increased hemodynamic demands. Will check cardiac monitor for further evaluation. ?-Check cardiac monitor ?-PO mag at night ?-Manage dizziness as above ? ? ?Patient seen and examined with Rasa Degrazia, DO and agree with above.  ? ?In brief, the patient is a 26 year old G7P1001 female at 26w gestation who presents to clinic with orthostatic symptoms and palpitations. Specifically, she cannot stand for prolonged periods of time as she has episodes where she feels lightheaded with associated palpitations. She had one episode of syncope at 9w but this has not recurred. Discussed that this is common in pregnancy and likely to persist due to increased venous pooling as pregnancy progresses. Will check cardiac monitor to ensure no arrhythmogenic etiology of symptoms.  ? ?GEN: No acute distress.   ?Neck: No JVD ?Cardiac: RRR, 1-2 systolic flow murmur ?Respiratory: Clear to auscultation bilaterally. ?GI: Gravid, soft ?MS: No edema; No deformity. ?Neuro:  Nonfocal  ?Psych: Normal affect   ? ?Plan: ?-Check 3 day zio due to palpitations ?-Increase hydration, slow position changes, compression socks, small frequent meals with electrolyte replacement ? ?[name], MD ? ?Patient Instructions  ?Medication Instructions:  ? ?Your physician recommends that you continue on your  current medications as directed. Please refer to the Current Medication list given to you today. ? ?*If you need a refill on your cardiac medications before your next appointment, please call your pharmacy* ? ? ?Testing/Procedures: ? ?ZIO XT- Long Term Monitor Instructions ? ?Your physician has requested you wear a ZIO patch monitor for 3 days.  ?This is a single patch monitor. [employer] supplies one patch monitor per enrollment. Additional ?stickers are not available. Please do not  apply patch if you will be having a Nuclear Stress Test,  ?Echocardiogram, Cardiac CT, MRI, or Chest Xray during the period you would be wearing the  ?monitor. The patch cannot be worn during these tests. You cannot remove and re-apply the  ?ZIO XT patch monitor.  ?Your ZIO patch monitor will be mailed 3 day USPS to your address on file. It may take 3-5 days  ?to receive your monitor after you have been enrolled.  ?Once you have received your monitor, please review the enclosed instructions. Your monitor  ?has already been registered assigning a specific monitor serial # to you. ? ?Billing and Patient Assistance Program Information ? ?We have supplied [employer] with any of your insurance information on file for billing purposes. ?[employer] offers a sliding scale Patient Assistance Program for patients that do not have  ?insurance, or whose insurance does not completely cover the cost of the ZIO monitor.  ?You must apply for the Patient Assistance Program to qualify for this discounted rate.  ?To apply, please call [employer] at [phone], select option 4, select option 2, ask to apply for  ?Patient Assistance Program. Theodore Demark will ask your household income, and how many people  ?are in your household. They will quote your out-of-pocket cost based on that information.  ?[employer] will also be able to set up a 52-month interest-free payment plan if needed. ? ?Applying the monitor ?  ?Shave hair from upper left chest.  ?Hold abrader disc by orange  tab. Rub abrader in 40 strokes over the upper left chest as  ?indicated in your monitor instructions.  ?Clean area with 4 enclosed alcohol pads. Let dry.  ?Apply patch as indicated in monitor instruction

## 2022-01-01 DIAGNOSIS — R002 Palpitations: Secondary | ICD-10-CM

## 2022-01-05 ENCOUNTER — Ambulatory Visit: Admitting: *Deleted

## 2022-01-05 ENCOUNTER — Other Ambulatory Visit (INDEPENDENT_AMBULATORY_CARE_PROVIDER_SITE_OTHER)

## 2022-01-05 ENCOUNTER — Ambulatory Visit: Attending: Obstetrics

## 2022-01-05 ENCOUNTER — Ambulatory Visit (HOSPITAL_BASED_OUTPATIENT_CLINIC_OR_DEPARTMENT_OTHER): Admitting: *Deleted

## 2022-01-05 VITALS — BP 109/55 | HR 74

## 2022-01-05 DIAGNOSIS — O09892 Supervision of other high risk pregnancies, second trimester: Secondary | ICD-10-CM | POA: Diagnosis not present

## 2022-01-05 DIAGNOSIS — Z3A27 27 weeks gestation of pregnancy: Secondary | ICD-10-CM | POA: Diagnosis not present

## 2022-01-05 DIAGNOSIS — O321XX0 Maternal care for breech presentation, not applicable or unspecified: Secondary | ICD-10-CM | POA: Diagnosis not present

## 2022-01-05 DIAGNOSIS — Z141 Cystic fibrosis carrier: Secondary | ICD-10-CM

## 2022-01-05 DIAGNOSIS — O365990 Maternal care for other known or suspected poor fetal growth, unspecified trimester, not applicable or unspecified: Secondary | ICD-10-CM | POA: Diagnosis not present

## 2022-01-05 DIAGNOSIS — O34219 Maternal care for unspecified type scar from previous cesarean delivery: Secondary | ICD-10-CM

## 2022-01-05 DIAGNOSIS — Z148 Genetic carrier of other disease: Secondary | ICD-10-CM | POA: Insufficient documentation

## 2022-01-05 DIAGNOSIS — Z3480 Encounter for supervision of other normal pregnancy, unspecified trimester: Secondary | ICD-10-CM

## 2022-01-05 DIAGNOSIS — O365920 Maternal care for other known or suspected poor fetal growth, second trimester, not applicable or unspecified: Secondary | ICD-10-CM | POA: Diagnosis present

## 2022-01-05 DIAGNOSIS — Z6791 Unspecified blood type, Rh negative: Secondary | ICD-10-CM

## 2022-01-05 LAB — HEPATITIS C ANTIBODY: HCV Ab: NEGATIVE

## 2022-01-05 IMAGING — US US MFM UA CORD DOPPLER
1 series · 15 of 28 positions shown · non-contrast
Comparison: none

[Series 1: us mfm ua cord doppler · 32 acquisitions, 15 frames shown]
[im 1/32]
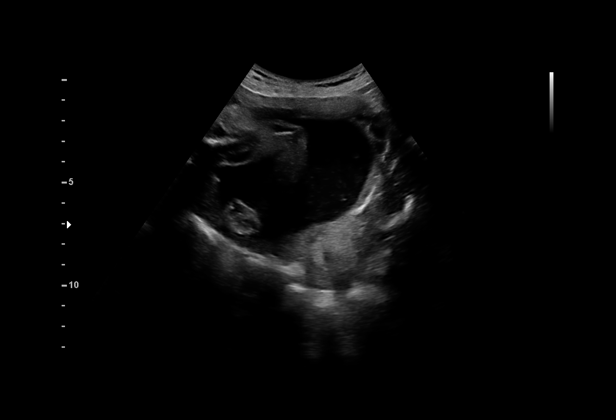
[im 3/32]
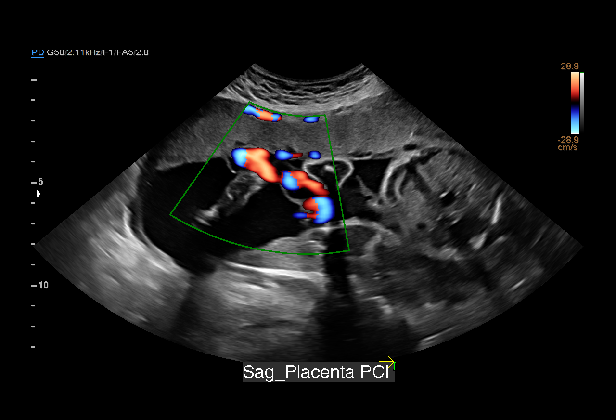
[im 5/32]
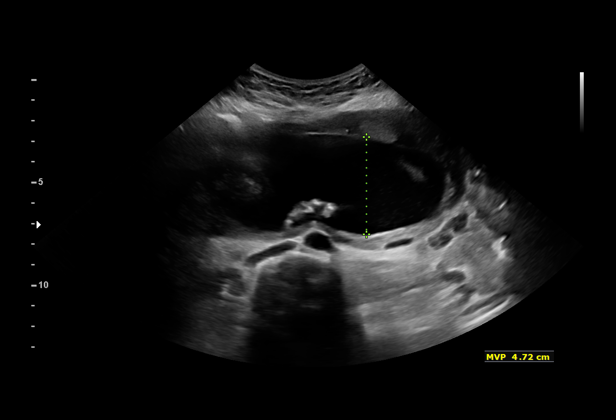
[im 7/32]
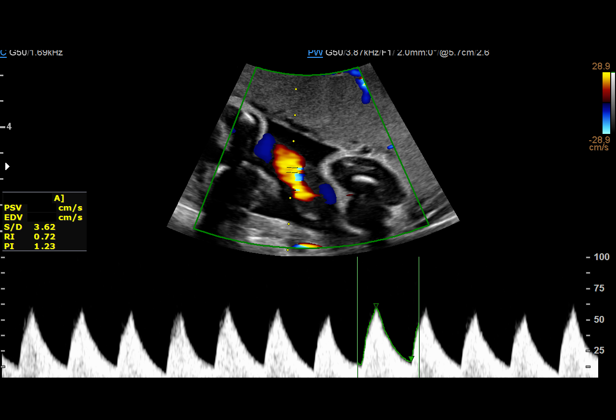
[im 10/32]
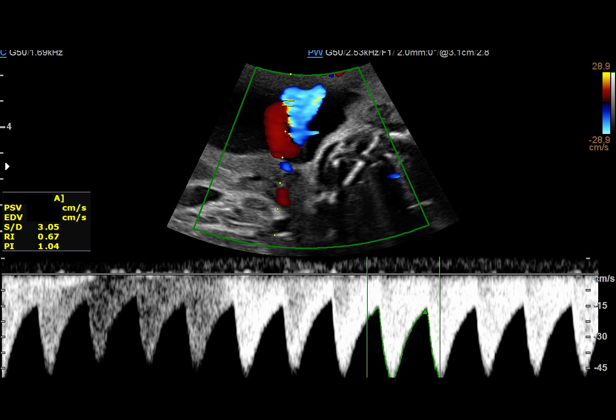
[im 12/32]
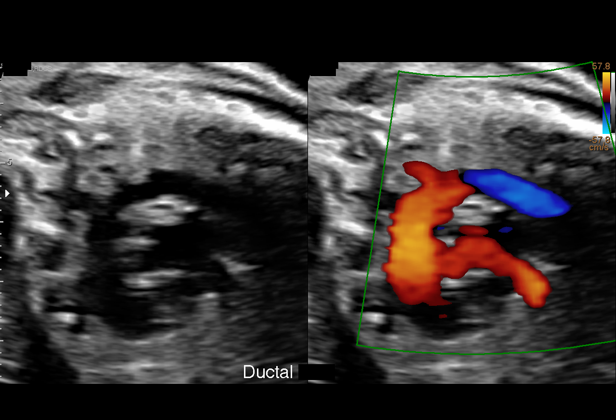
[im 14/32]
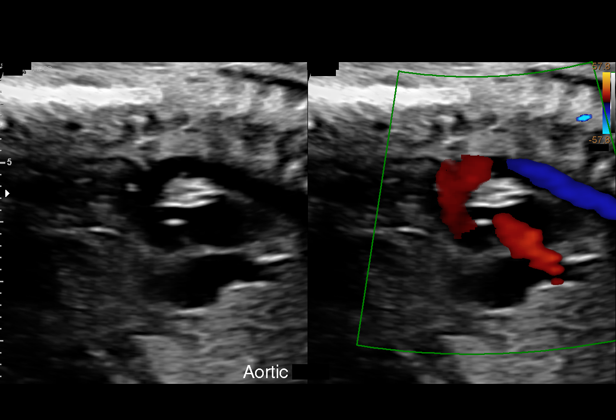
[im 17/32]
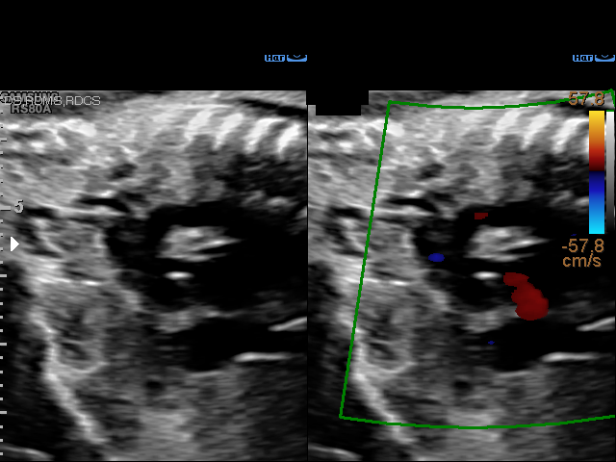
[im 18/32]
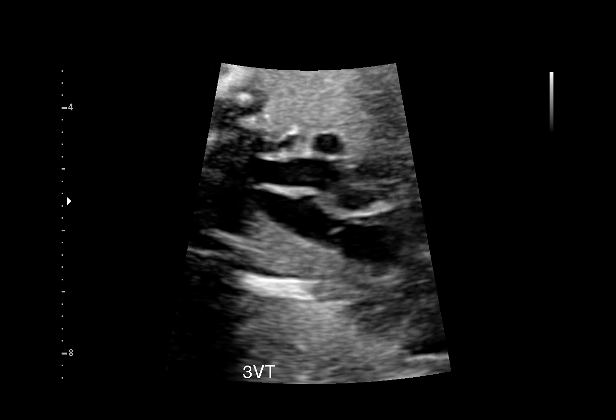
[im 20/32]
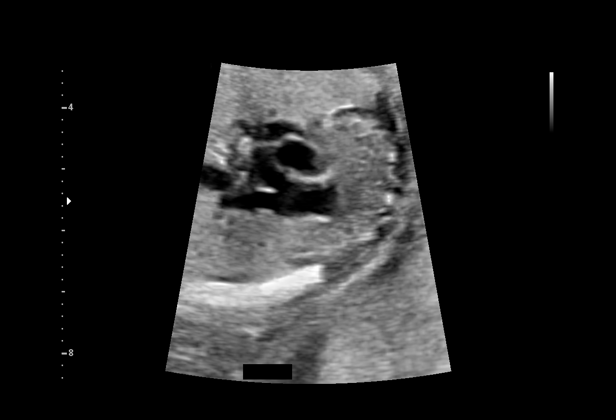
[im 22/32]
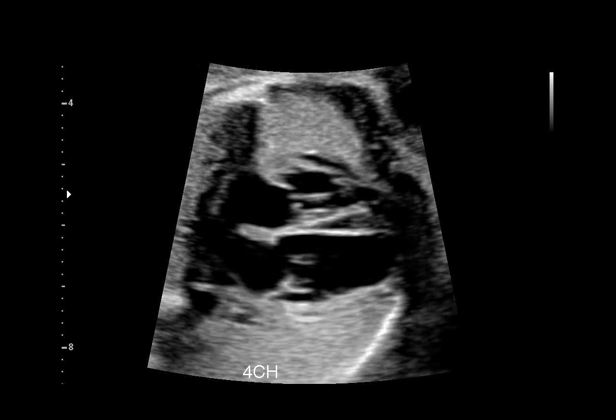
[im 25/32]
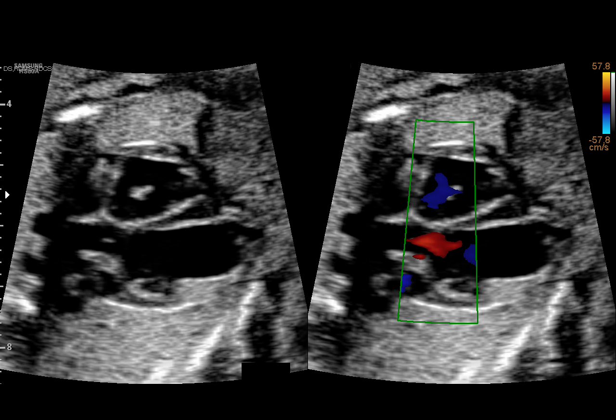
[im 27/32]
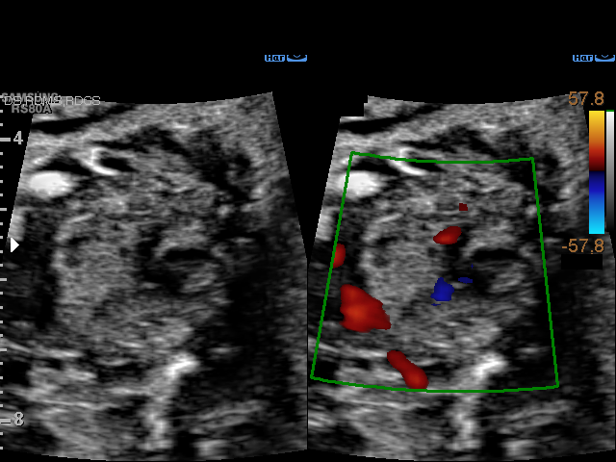
[im 29/32]
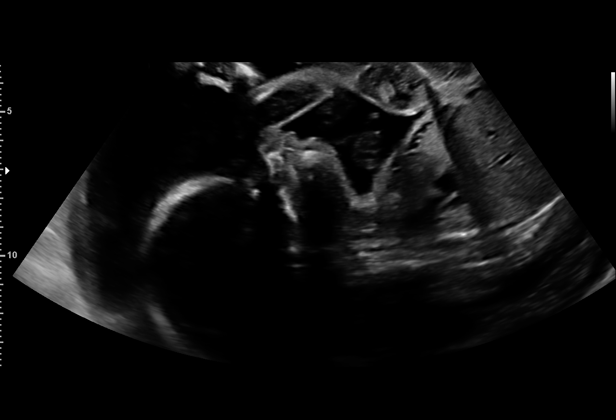
[im 32/32]
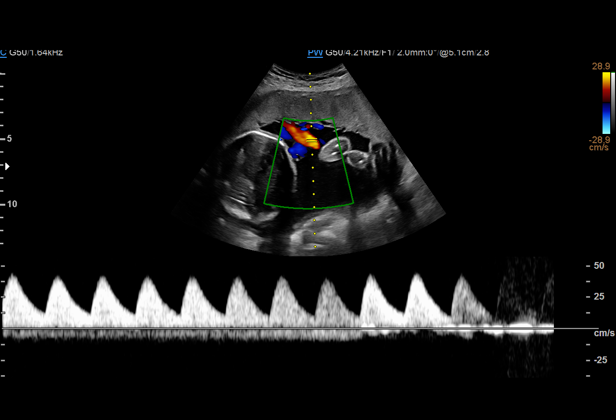

[15 of 28 positions shown; findings below may reference images not displayed]

Addendum:\.br----------------------------------------------------------------------
----------------------------------------------------------------------

----------------------------------------------------------------------

----------------------------------------------------------------------

----------------------------------------------------------------------

----------------------------------------------------------------------
Indications

 27 weeks gestation of pregnancy
 Maternal care for known or suspected poor      [0O]
 fetal growth, second trimester, not applicable
 or unspecified IUGR
 Cystic Fibrosis (CF) Carrier, second trimester [0O]
 Genetic carrier (MATTA)                   [0O]
 LR Nips/Neg AFP
----------------------------------------------------------------------
Fetal Evaluation

 Num Of Fetuses:         1
 Fetal Heart Rate(bpm):  153
 Cardiac Activity:       Observed
 Presentation:           Breech
 Placenta:               Anterior
 P. Cord Insertion:      Visualized

 Amniotic Fluid
 AFI FV:      Within normal limits

                             Largest Pocket(cm)

----------------------------------------------------------------------
OB History

 Gravidity:    2         Term:   1
 Living:       1
----------------------------------------------------------------------
Gestational Age

 LMP:           27w 0d        Date:  [DATE]                 EDD:   [DATE]
 Best:          27w 0d     Det. By:  LMP  ([DATE])          EDD:   [DATE]
----------------------------------------------------------------------
Anatomy

 Cranium:               Previously seen        Aortic Arch:            Appears normal
 Cavum:                 Previously seen        Ductal Arch:            Appears normal
 Ventricles:            Previously seen        Diaphragm:              Previously seen
 Choroid Plexus:        Previously seen        Stomach:                Previously Seen
 Cerebellum:            Previously seen        Abdomen:                Previously seen
 Posterior Fossa:       Previously seen        Abdominal Wall:         Previously seen
 Face:                  Orbits appear          Cord Vessels:           Previously seen
                        normal
 Lips:                  Previously seen        Kidneys:                Previously seen
 Palate:                Appears normal         Bladder:                Previously seen
 Thoracic:              Previously seen        Spine:                  Previously seen
 Heart:                 Appears normal         Upper Extremities:      Previously seen
                        (4CH, axis, and
                        situs)
 RVOT:                  Appears normal         Lower Extremities:      Previously seen
 LVOT:                  Appears normal

 Other:  VC, 3VV and 3VTV visualized.
----------------------------------------------------------------------
Doppler - Fetal Vessels

 Umbilical Artery
  S/D     %tile      RI    %tile      PI    %tile
  3.49       68    0.71       70    1.15       73

----------------------------------------------------------------------
Impression

 Antenatal testing due to FGR with an EFW 5%
 Reactive NST with good fetal movement and amniotic fluid
 volume
 UA Dopplers are normal with no evidence of AEDF or REDF

 Ms. MATTA reported decreased fetal movement but felt
 better after seeing the reactive NST.
----------------------------------------------------------------------
Recommendations

 Continue weekly testing with UA Dopplers.
 Growth scheduled in 1 week.
----------------------------------------------------------------------
----------------------------------------------------------------------

*** End of Addendum ***\.br----------------------------------------------------------------------
----------------------------------------------------------------------

----------------------------------------------------------------------

----------------------------------------------------------------------

----------------------------------------------------------------------

----------------------------------------------------------------------
Indications

 27 weeks gestation of pregnancy
 Maternal care for known or suspected poor      [0O]
 fetal growth, second trimester, not applicable
 or unspecified IUGR
 Cystic Fibrosis (CF) Carrier, second trimester [0O]
 Genetic carrier (MATTA)                   [0O]
 LR Nips/Neg AFP
----------------------------------------------------------------------
Fetal Evaluation

 Num Of Fetuses:         1
 Fetal Heart Rate(bpm):  153
 Cardiac Activity:       Observed
 Presentation:           Breech
 Placenta:               Anterior
 P. Cord Insertion:      Visualized

 Amniotic Fluid
 AFI FV:      Within normal limits

                             Largest Pocket(cm)

----------------------------------------------------------------------
OB History

 Gravidity:    2         Term:   1
 Living:       1
----------------------------------------------------------------------
Gestational Age

 LMP:           27w 0d        Date:  [DATE]                 EDD:   [DATE]
 Best:          27w 0d     Det. By:  LMP  ([DATE])          EDD:   [DATE]
----------------------------------------------------------------------
Anatomy

 Cranium:               Previously seen        Aortic Arch:            Appears normal
 Cavum:                 Previously seen        Ductal Arch:            Appears normal
 Ventricles:            Previously seen        Diaphragm:              Previously seen
 Choroid Plexus:        Previously seen        Stomach:                Previously Seen
 Cerebellum:            Previously seen        Abdomen:                Previously seen
 Posterior Fossa:       Previously seen        Abdominal Wall:         Previously seen
 Face:                  Orbits appear          Cord Vessels:           Previously seen
                        normal
 Lips:                  Previously seen        Kidneys:                Previously seen
 Palate:                Appears normal         Bladder:                Previously seen
 Thoracic:              Previously seen        Spine:                  Previously seen
 Heart:                 Appears normal         Upper Extremities:      Previously seen
                        (4CH, axis, and
                        situs)
 RVOT:                  Appears normal         Lower Extremities:      Previously seen
 LVOT:                  Appears normal

 Other:  VC, 3VV and 3VTV visualized.
----------------------------------------------------------------------
Doppler - Fetal Vessels

 Umbilical Artery
  S/D     %tile      RI    %tile      PI    %tile
  3.49       68    0.71       70    1.15       73

----------------------------------------------------------------------
Impression

 Antenatal testing due to FGR with an EFW 5%
 Reactive NST with good fetal movement and amniotic fluid
 volume
 UA Dopplers are normal with no evidence of AEDF or REDF

 Ms. MATTA reported decreased fetal movement but felt
 better after seeing the reactive NST.
----------------------------------------------------------------------
Recommendations

 Continue weekly testing with UA Dopplers.
 Growth scheduled in 1 week.
----------------------------------------------------------------------
----------------------------------------------------------------------

## 2022-01-05 MED ORDER — RHO D IMMUNE GLOBULIN 1500 UNIT/2ML IJ SOSY
300.0000 ug | PREFILLED_SYRINGE | Freq: Once | INTRAMUSCULAR | Status: AC
  Administered 2022-01-05: 300 ug via INTRAMUSCULAR

## 2022-01-05 NOTE — Procedures (Signed)
Gina Pearson [birth year] [redacted]w[redacted]d Fetus A Non-Stress Test Interpretation for 01/05/22  Indication:  FGR  Fetal Heart Rate A Mode: External Baseline Rate (A): 150 bpm Variability: Moderate Accelerations: 10 x 10 Decelerations: None Multiple birth?: No  Uterine Activity Mode: Palpation, Toco Contraction Frequency (min): none Resting Tone Palpated: Relaxed  Interpretation (Fetal Testing) Nonstress Test Interpretation: Reactive Overall Impression: Reassuring for gestational age Comments: Dr. BGertie Exonreviewed tracing

## 2022-01-05 NOTE — Progress Notes (Signed)
Pt here for 28 week labs. Rhogam given per Noni Saupe, NP. Pt sent to lab.

## 2022-01-08 LAB — 2HR GTT W 1 HR, CARPENTER, 75 G
Glucose, 1 Hr, Gest: 77 mg/dL (ref 65–179)
Glucose, 2 Hr, Gest: 65 mg/dL (ref 65–152)
Glucose, Fasting, Gest: 80 mg/dL (ref 65–91)

## 2022-01-08 LAB — HIV ANTIBODY (ROUTINE TESTING W REFLEX): HIV 1&2 Ab, 4th Generation: NONREACTIVE

## 2022-01-08 LAB — HEPATITIS C ANTIBODY
Hepatitis C Ab: NONREACTIVE
SIGNAL TO CUT-OFF: 0.16 (ref <1.00)

## 2022-01-08 LAB — ANTIBODY SCREEN: Antibody Screen: NOT DETECTED

## 2022-01-08 LAB — RPR: RPR Ser Ql: NONREACTIVE

## 2022-01-09 ENCOUNTER — Ambulatory Visit

## 2022-01-11 NOTE — Progress Notes (Signed)
PRENATAL VISIT NOTE  Subjective:  Gina Pearson is a 26 y.o. G2P1001 at 73w6dbeing seen today for ongoing prenatal care.  She is currently monitored for the following issues for this high-risk pregnancy and has Supervision of other normal pregnancy, antepartum; Pseudoseizure; Blood type, Rh negative; Allergic rhinitis, unspecified; Borderline personality disorder ([place]; Depressive disorder; Recurrent urinary tract infection; Acute stress reaction; Superficial (introital) dyspareunia; Ganglion, left wrist; Hemorrhoid; Low grade squamous intraepithelial lesion on cytologic smear of cervix (LGSIL); Migraine, unspecified, not intractable, without status migrainosus; Mixed emotional features as adjustment reaction; 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  Patient reports no complaints.  Contractions: Not present. Vag. Bleeding: None.  Movement: Present. Denies leaking of fluid.   The following portions of the patient's history were reviewed and updated as appropriate: allergies, current medications, past family history, past medical history, past social history, past surgical history and problem list.   Objective:   Vitals:   01/18/22 1451  BP: 115/75  Pulse: 94  Weight: 142 lb (64.4 kg)    Fetal Status:     Movement: Present     General:  Alert, oriented and cooperative. Patient is in no acute distress.  Skin: Skin is warm and dry. No rash noted.   Cardiovascular: Normal heart rate noted  Respiratory: Normal respiratory effort, no problems with respiration noted  Abdomen: Soft, gravid, appropriate for gestational age.  Pain/Pressure: Absent     Pelvic: Cervical exam deferred        Extremities: Normal range of motion.  Edema: None  Mental Status: Normal mood and affect. Normal behavior. Normal judgment and thought content.   Assessment and Plan:  Pregnancy: G2P1001 at 272w6d. History of cesarean  section - We discussed her history of c-section. Her previous c-section was due to   anal fissures at the time .  She has a history of  no prior successful vaginal deliveries - We discussed the risks associated with repeat c-section: bleeding, infection, injury to surrounding organs/tissues I.e. bowel/bladder, development of scar tissue, wound complications such as wound separation or infection, need for additional surgery, percreta/acreta - We discussed the risks associated with TOLAC: risk of it being unsuccessful, specially in the context of her history, the risks in general of a vaginal delivery (prolapse, SUI, differences in recovery, pelvic floor dysfunction, etc), and the risk of uterine rupture. We discussed with the risk of uterine rupture that while rare it is not easily predicted, that it is a surgical emergency, and it can be potentially catastrophic for mom and baby. We discussed if uterine rupture that it may necessitate hysterectomy if the rupture caused issues with bleeding that could not be managed with other surgical options.  - After counseling, the patient was given the opportunity to ask questions and all questions answered.  - After considering her options, she would like take some time to consider the options. I signed her consent forms that she will take home and decide on. She was instructed to initial next to her preference and bring the form back after signing.  - Information provided to the patient   2. Blood type, Rh negative - Rhogam given 5/19  3. Supervision of other normal pregnancy, antepartum 28 wk labs Recommended TDAP - pt declines at this time but will consider.   4. Recurrent urinary tract infection Check Ucx as screening   5. Fetal growth restriction - Reviewed 5%ile and reassured by  symmetric growth of AC/HC. Discussed AC is >3%ile. Timing of delivery will depend on trajectory of growth and UADs (which are currently normal).  - Has f/u scheduled with  MFM  Preterm labor symptoms and general obstetric precautions including but not limited to vaginal bleeding, contractions, leaking of fluid and fetal movement were reviewed in detail with the patient. Please refer to After Visit Summary for other counseling recommendations.   No follow-ups on file.  Future Appointments  Date Time Provider [place]  01/19/2022 11:00 AM [place] Darrol Angel [facility]  01/29/2022  1:30 PM [facility] NURSE [facility]  01/29/2022  1:45 PM [facility] US4 [facility]  02/05/2022  1:45 PM [facility] NURSE [facility]  02/05/2022  2:00 PM [facility] US1 [facility] [place]    Radene Gunning, MD

## 2022-01-16 ENCOUNTER — Ambulatory Visit: Payer: No Typology Code available for payment source | Attending: Obstetrics

## 2022-01-16 ENCOUNTER — Ambulatory Visit: Payer: No Typology Code available for payment source | Admitting: *Deleted

## 2022-01-16 VITALS — BP 116/68 | HR 92

## 2022-01-16 DIAGNOSIS — O321XX0 Maternal care for breech presentation, not applicable or unspecified: Secondary | ICD-10-CM | POA: Insufficient documentation

## 2022-01-16 DIAGNOSIS — Z3A28 28 weeks gestation of pregnancy: Secondary | ICD-10-CM | POA: Diagnosis not present

## 2022-01-16 DIAGNOSIS — D563 Thalassemia minor: Secondary | ICD-10-CM | POA: Insufficient documentation

## 2022-01-16 DIAGNOSIS — Z3480 Encounter for supervision of other normal pregnancy, unspecified trimester: Secondary | ICD-10-CM | POA: Insufficient documentation

## 2022-01-16 DIAGNOSIS — O285 Abnormal chromosomal and genetic finding on antenatal screening of mother: Secondary | ICD-10-CM | POA: Diagnosis not present

## 2022-01-16 DIAGNOSIS — O365920 Maternal care for other known or suspected poor fetal growth, second trimester, not applicable or unspecified: Secondary | ICD-10-CM | POA: Insufficient documentation

## 2022-01-16 DIAGNOSIS — O34219 Maternal care for unspecified type scar from previous cesarean delivery: Secondary | ICD-10-CM | POA: Diagnosis not present

## 2022-01-16 DIAGNOSIS — Z141 Cystic fibrosis carrier: Secondary | ICD-10-CM | POA: Diagnosis not present

## 2022-01-16 DIAGNOSIS — O09892 Supervision of other high risk pregnancies, second trimester: Secondary | ICD-10-CM | POA: Insufficient documentation

## 2022-01-16 DIAGNOSIS — O365930 Maternal care for other known or suspected poor fetal growth, third trimester, not applicable or unspecified: Secondary | ICD-10-CM

## 2022-01-16 DIAGNOSIS — Z6791 Unspecified blood type, Rh negative: Secondary | ICD-10-CM

## 2022-01-16 IMAGING — US US MFM UA CORD DOPPLER
1 series · 13 of 28 positions shown · non-contrast
Comparison: none

[Series 1: us mfm ua cord doppler · 13 of 31 slices shown]
[im 2/31]
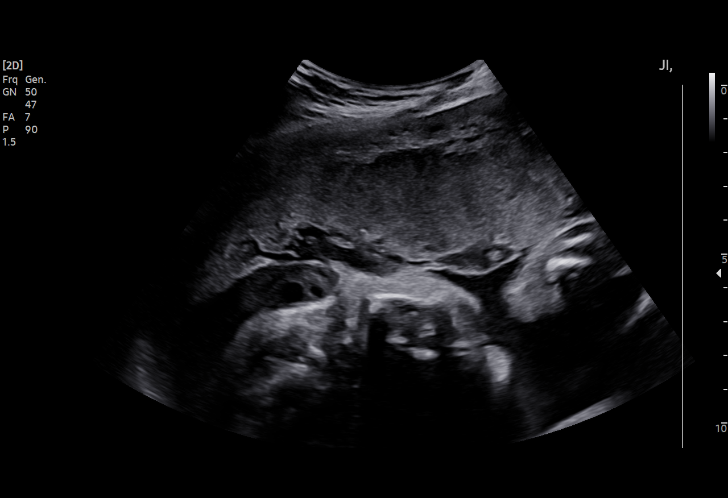
[im 4/31]
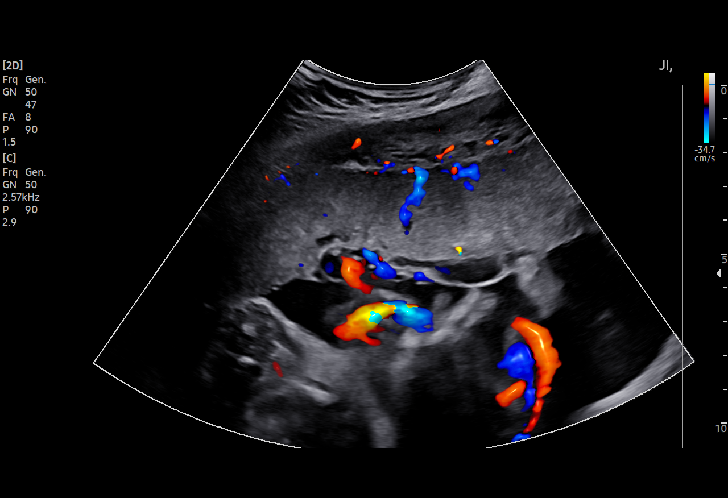
[im 6/31]
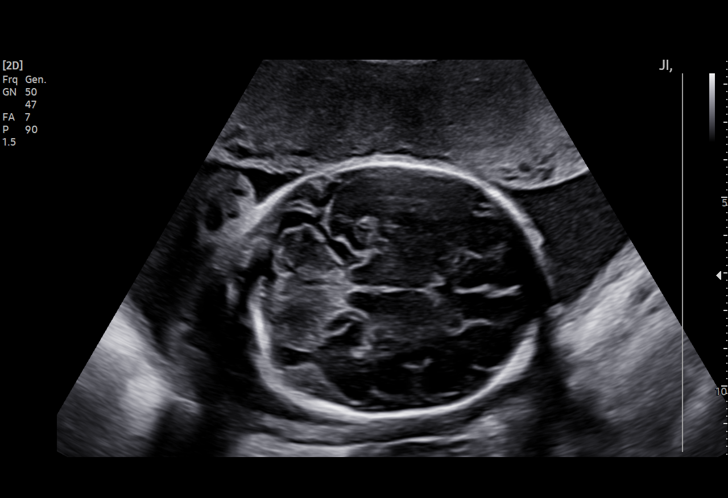
[im 8/31]
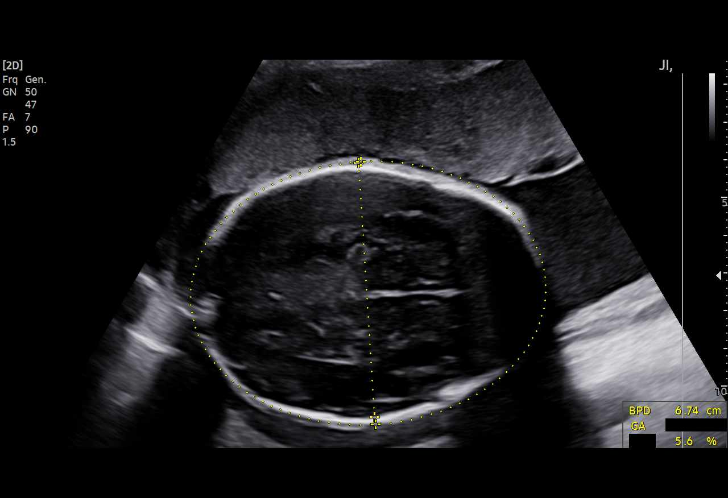
[im 11/31]
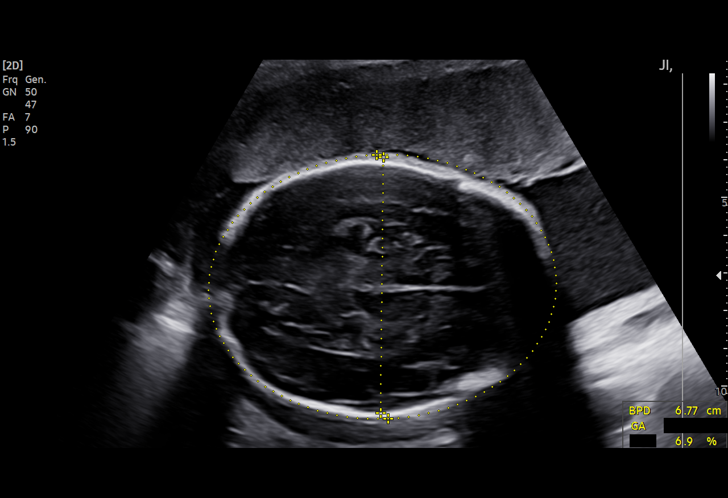
[im 13/31]
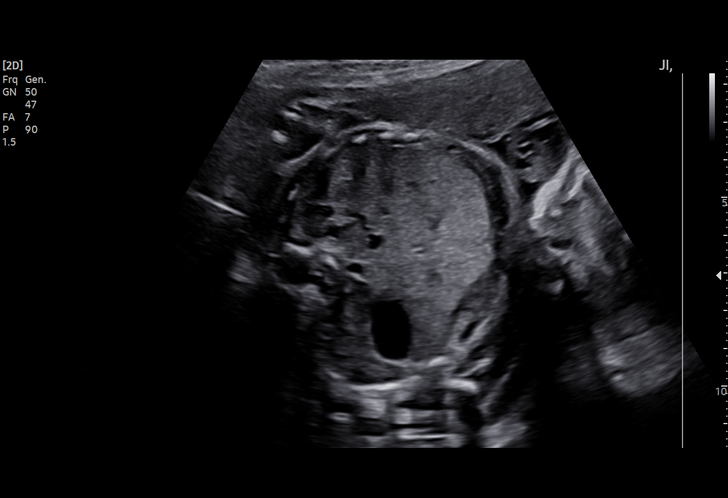
[im 16/31]
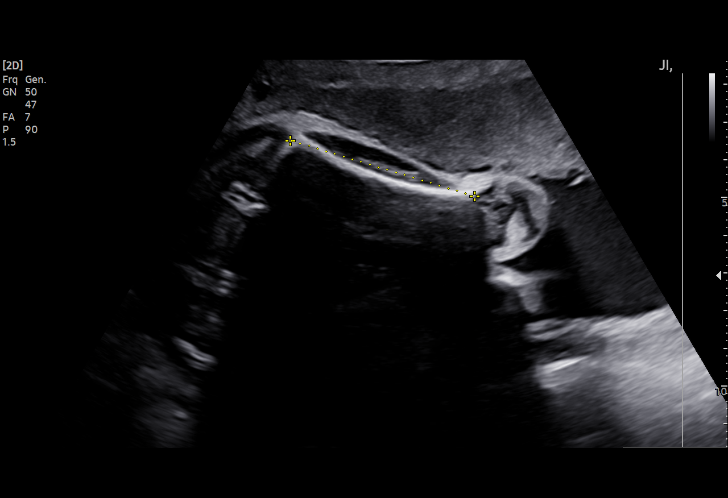
[im 18/31]
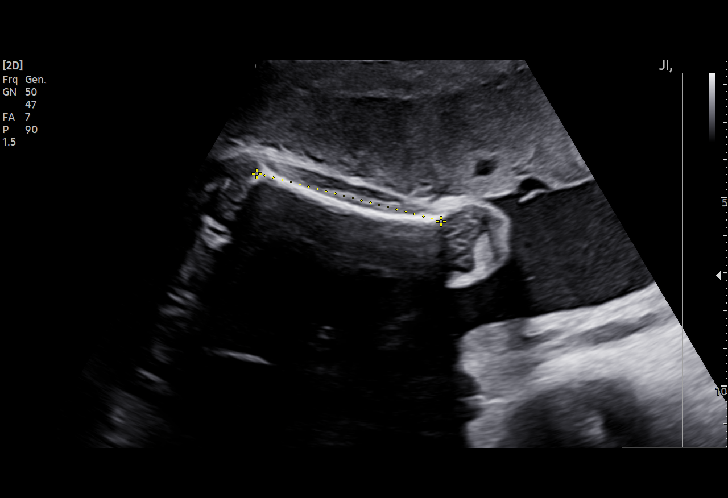
[im 21/31]
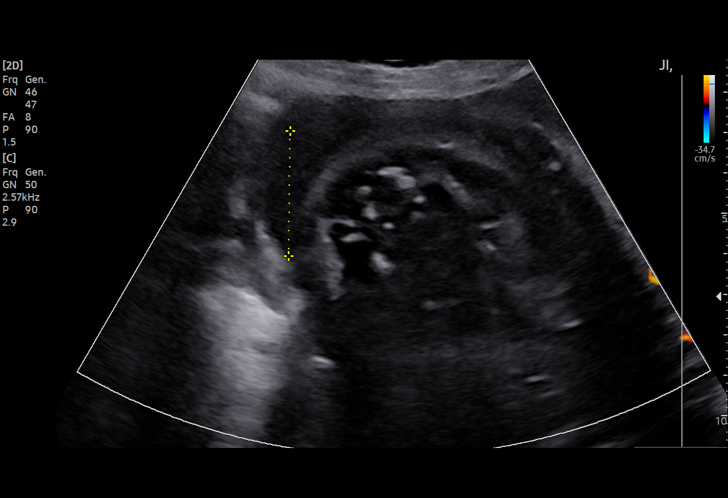
[im 23/31]
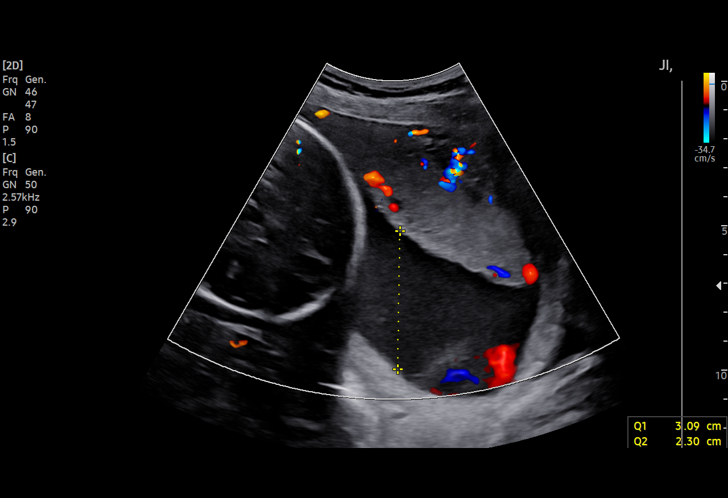
[im 25/31]
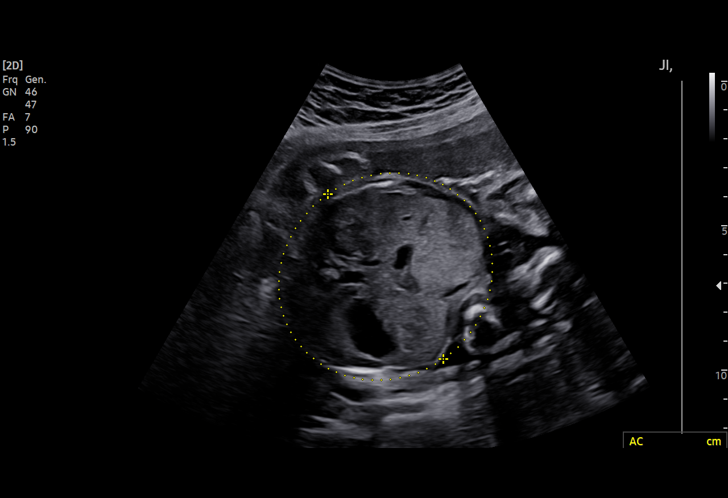
[im 27/31]
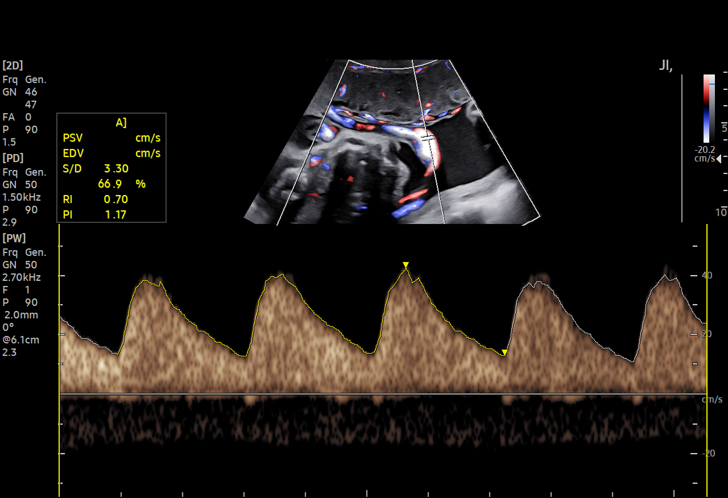
[im 29/31]
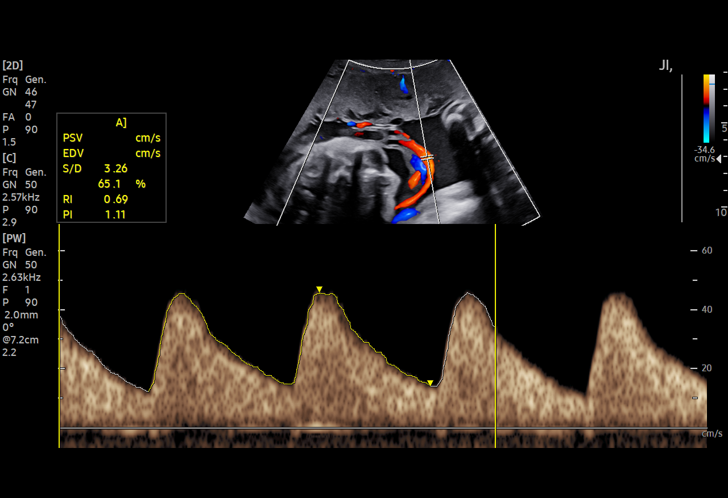

[13 of 28 positions shown; findings below may reference images not displayed]

Indications

 28 weeks gestation of pregnancy
 Maternal care for known or suspected poor
 fetal growth, third trimester, not applicable or
 unspecified IUGR
 Cystic Fibrosis (CF) Carrier, third trimester
 Genetic carrier ([place])
 LR Nips/Neg AFP
Vital Signs

 BP:          116/68
Fetal Evaluation

 Num Of Fetuses:         1
 Fetal Heart Rate(bpm):  124
 Cardiac Activity:       Observed
 Presentation:           Breech
 Placenta:               Anterior
 P. Cord Insertion:      Visualized, central

 Amniotic Fluid
 AFI FV:      Subjectively low-normal

 AFI Sum(cm)     %Tile       Largest Pocket(cm)
 10.16           12

 RUQ(cm)       RLQ(cm)       LUQ(cm)        LLQ(cm)
 3.09          0
Biometry

 BPD:      67.4  mm     G. Age:  27w 1d          6  %    CI:        69.49   %    70 - 86
                                                         FL/HC:      19.6   %    19.6 -
 HC:      258.1  mm     G. Age:  28w 0d          9  %    HC/AC:      1.14        0.99 -
 AC:      225.6  mm     G. Age:  27w 0d          7  %    FL/BPD:     75.2   %    71 - 87
 FL:       50.7  mm     G. Age:  27w 1d          7  %    FL/AC:      22.5   %    20 - 24

 Est. FW:    3800  gm      2 lb 5 oz      5  %
OB History

 Gravidity:    2         Term:   1
 Living:       1
Gestational Age

 LMP:           28w 4d        Date:  06/30/21                 EDD:   04/06/22
 U/S Today:     27w 2d                                        EDD:   04/15/22
 Best:          28w 4d     Det. By:  LMP  (06/30/21)          EDD:   04/06/22
Anatomy

 Cranium:               Previously seen        Aortic Arch:            Previously seen
 Cavum:                 Previously seen        Ductal Arch:            Previously seen
 Ventricles:            Previously seen        Diaphragm:              Previously seen
 Choroid Plexus:        Previously seen        Stomach:                Appears normal, left
                                                                       sided
 Cerebellum:            Previously seen        Abdomen:                Previously seen
 Posterior Fossa:       Previously seen        Abdominal Wall:         Previously seen
 Face:                  Orbits and profile     Cord Vessels:           Previously seen
                        previously seen
 Lips:                  Previously seen        Kidneys:                Appear normal
 Palate:                Appears normal         Bladder:                Appears normal
 Thoracic:              Previously seen        Spine:                  Previously seen
 Heart:                 Appears normal         Upper Extremities:      Previously seen
                        (4CH, axis, and
                        situs)
 RVOT:                  Previously seen        Lower Extremities:      Previously seen
 LVOT:                  Previously seen

 Other:  VC, 3VV and 3VTV previously visualized.
Doppler - Fetal Vessels

 Umbilical Artery
  S/D     %tile      RI    %tile      PI    %tile     PSV    ADFV    RDFV
                                                    (cm/s)
   3.3       67     0.7       73     1.1       72    45.78      No      No

Impression
 Fetal growth restriction.  On previous growth assessment, the
 estimated fetal weight was at the 5th percentile.  She does
 not have gestational diabetes.  Blood pressure today at her
 office is 116/68 mmHg.
 On today's ultrasound, the estimated fetal weight is at the 5th
 percentile and the abdominal circumference measurement is
 at the 7th percentile.  Interval weight gain is 490 g.  Amniotic
 fluid is normal and good fetal activity seen.  Breech
 presentation.  Umbilical artery Doppler showed normal
 forward diastolic flow.
 We reassured the patient of the findings.

Recommendations

 -UA Doppler in 2 weeks.
 -Fetal growth and UAD in 3 weeks.
                Wyrick, Andrea

## 2022-01-17 ENCOUNTER — Other Ambulatory Visit: Payer: Self-pay | Admitting: *Deleted

## 2022-01-17 DIAGNOSIS — O34219 Maternal care for unspecified type scar from previous cesarean delivery: Secondary | ICD-10-CM

## 2022-01-17 DIAGNOSIS — O365930 Maternal care for other known or suspected poor fetal growth, third trimester, not applicable or unspecified: Secondary | ICD-10-CM

## 2022-01-18 ENCOUNTER — Encounter: Payer: Self-pay | Admitting: Obstetrics and Gynecology

## 2022-01-18 ENCOUNTER — Ambulatory Visit (INDEPENDENT_AMBULATORY_CARE_PROVIDER_SITE_OTHER): Payer: No Typology Code available for payment source | Admitting: Obstetrics and Gynecology

## 2022-01-18 VITALS — BP 115/75 | HR 94 | Wt 142.0 lb

## 2022-01-18 DIAGNOSIS — O365990 Maternal care for other known or suspected poor fetal growth, unspecified trimester, not applicable or unspecified: Secondary | ICD-10-CM | POA: Insufficient documentation

## 2022-01-18 DIAGNOSIS — Z6791 Unspecified blood type, Rh negative: Secondary | ICD-10-CM

## 2022-01-18 DIAGNOSIS — N390 Urinary tract infection, site not specified: Secondary | ICD-10-CM

## 2022-01-18 DIAGNOSIS — Z98891 History of uterine scar from previous surgery: Secondary | ICD-10-CM

## 2022-01-18 DIAGNOSIS — N3000 Acute cystitis without hematuria: Secondary | ICD-10-CM

## 2022-01-18 DIAGNOSIS — Z3480 Encounter for supervision of other normal pregnancy, unspecified trimester: Secondary | ICD-10-CM

## 2022-01-19 ENCOUNTER — Institutional Professional Consult (permissible substitution): Admitting: Physician Assistant

## 2022-01-21 LAB — URINE CULTURE, OB REFLEX

## 2022-01-21 LAB — CULTURE, OB URINE

## 2022-01-21 MED ORDER — NITROFURANTOIN MONOHYD MACRO 100 MG PO CAPS: 100.0000 mg | ORAL_CAPSULE | Freq: Two times a day (BID) | ORAL | 1 refills | Status: DC

## 2022-01-21 NOTE — Addendum Note (Signed)
Addended by: Radene Gunning A on: 01/21/2022 04:49 PM   Modules accepted: Orders

## 2022-01-29 ENCOUNTER — Ambulatory Visit (HOSPITAL_BASED_OUTPATIENT_CLINIC_OR_DEPARTMENT_OTHER): Payer: No Typology Code available for payment source | Admitting: *Deleted

## 2022-01-29 ENCOUNTER — Ambulatory Visit: Payer: No Typology Code available for payment source | Admitting: *Deleted

## 2022-01-29 ENCOUNTER — Encounter: Payer: Self-pay | Admitting: *Deleted

## 2022-01-29 ENCOUNTER — Ambulatory Visit: Payer: No Typology Code available for payment source | Attending: Obstetrics & Gynecology

## 2022-01-29 ENCOUNTER — Other Ambulatory Visit: Payer: Self-pay | Admitting: *Deleted

## 2022-01-29 VITALS — BP 127/66 | HR 82

## 2022-01-29 DIAGNOSIS — O99893 Other specified diseases and conditions complicating puerperium: Secondary | ICD-10-CM | POA: Insufficient documentation

## 2022-01-29 DIAGNOSIS — O360130 Maternal care for anti-D [Rh] antibodies, third trimester, not applicable or unspecified: Secondary | ICD-10-CM

## 2022-01-29 DIAGNOSIS — O360190 Maternal care for anti-D [Rh] antibodies, unspecified trimester, not applicable or unspecified: Secondary | ICD-10-CM | POA: Insufficient documentation

## 2022-01-29 DIAGNOSIS — Z141 Cystic fibrosis carrier: Secondary | ICD-10-CM | POA: Diagnosis not present

## 2022-01-29 DIAGNOSIS — Z6791 Unspecified blood type, Rh negative: Secondary | ICD-10-CM | POA: Insufficient documentation

## 2022-01-29 DIAGNOSIS — Z3480 Encounter for supervision of other normal pregnancy, unspecified trimester: Secondary | ICD-10-CM | POA: Insufficient documentation

## 2022-01-29 DIAGNOSIS — Z3A30 30 weeks gestation of pregnancy: Secondary | ICD-10-CM | POA: Insufficient documentation

## 2022-01-29 DIAGNOSIS — O285 Abnormal chromosomal and genetic finding on antenatal screening of mother: Secondary | ICD-10-CM

## 2022-01-29 DIAGNOSIS — O365930 Maternal care for other known or suspected poor fetal growth, third trimester, not applicable or unspecified: Secondary | ICD-10-CM

## 2022-01-29 DIAGNOSIS — Z148 Genetic carrier of other disease: Secondary | ICD-10-CM | POA: Diagnosis not present

## 2022-01-29 DIAGNOSIS — O34219 Maternal care for unspecified type scar from previous cesarean delivery: Secondary | ICD-10-CM | POA: Insufficient documentation

## 2022-01-29 DIAGNOSIS — O365990 Maternal care for other known or suspected poor fetal growth, unspecified trimester, not applicable or unspecified: Secondary | ICD-10-CM

## 2022-01-29 IMAGING — US US MFM OB LIMITED
1 series · 14 of 28 positions shown · non-contrast
Comparison: none

[Series 1: us mfm ob limited · 14 of 42 slices shown]
[im 2/42]
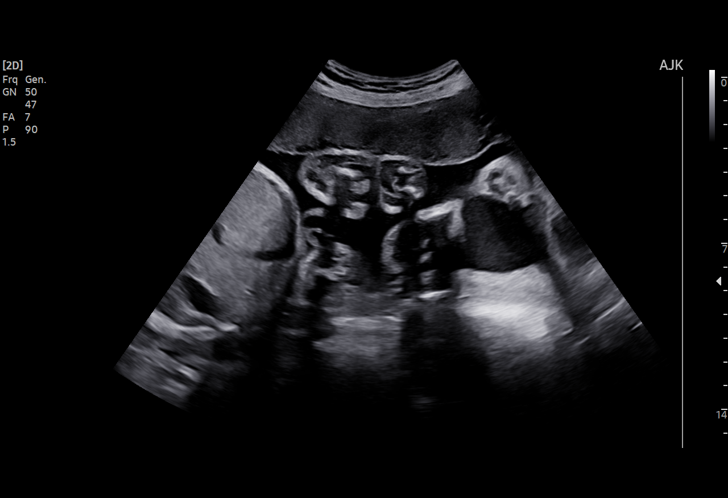
[im 5/42]
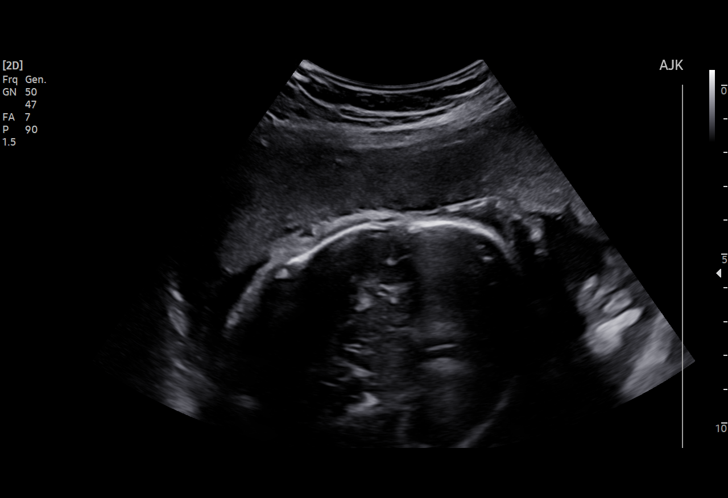
[im 8/42]
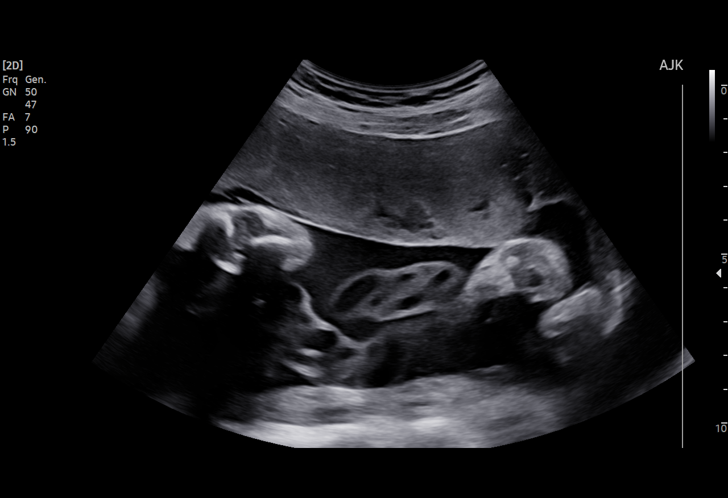
[im 11/42]
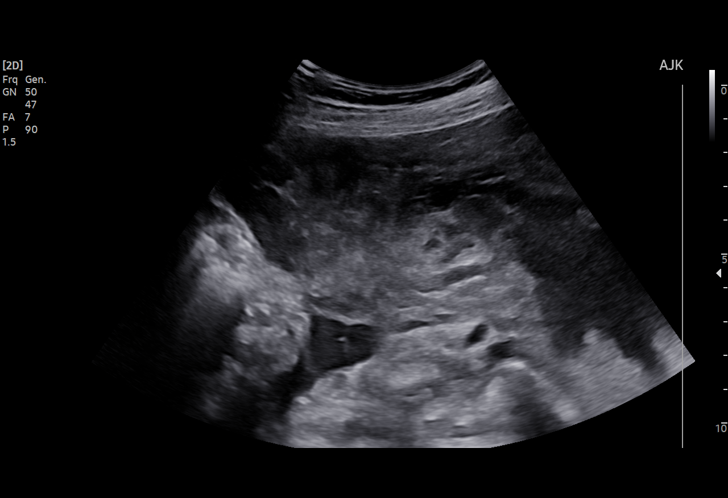
[im 14/42]
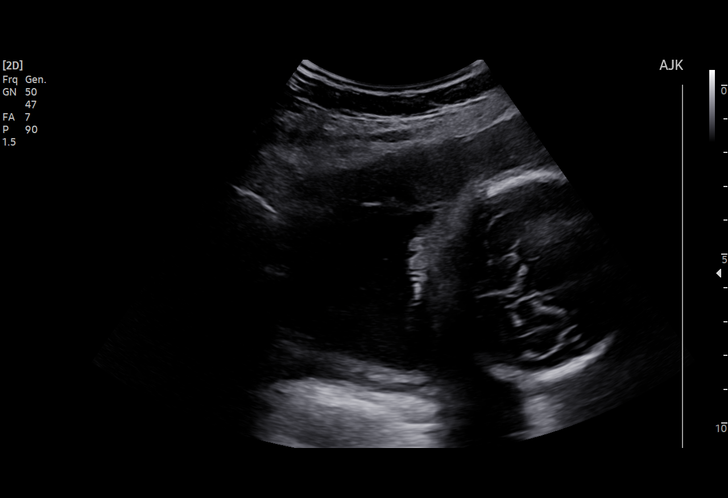
[im 17/42]
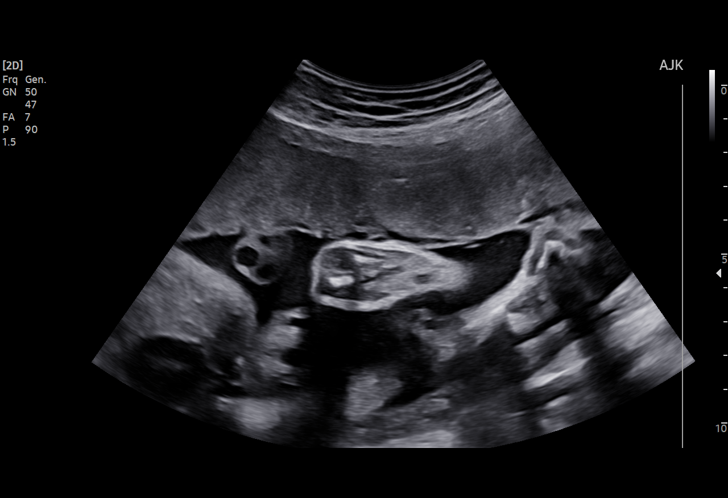
[im 20/42]
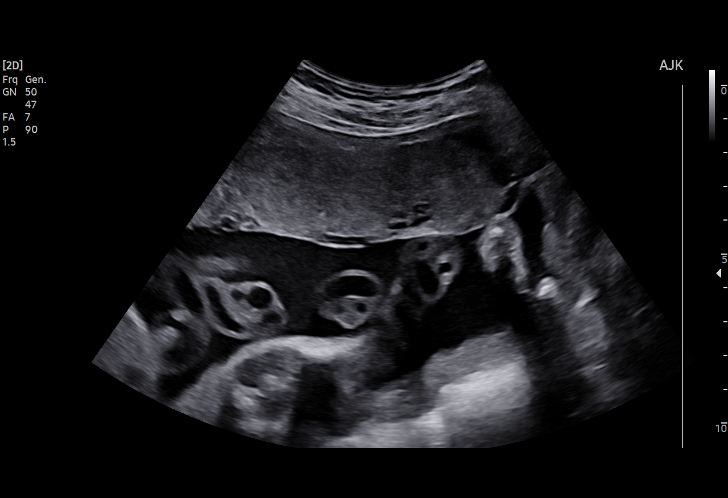
[im 23/42]
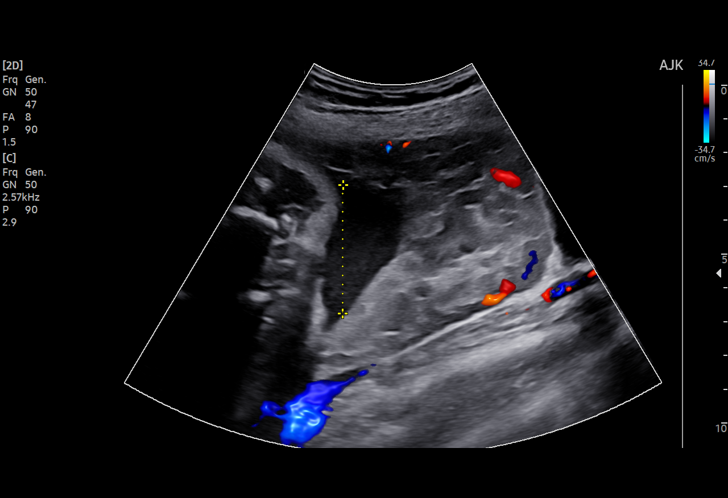
[im 26/42]
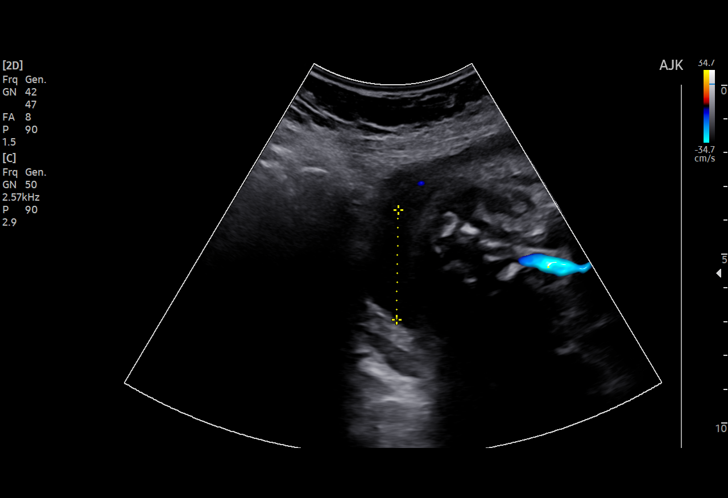
[im 29/42]
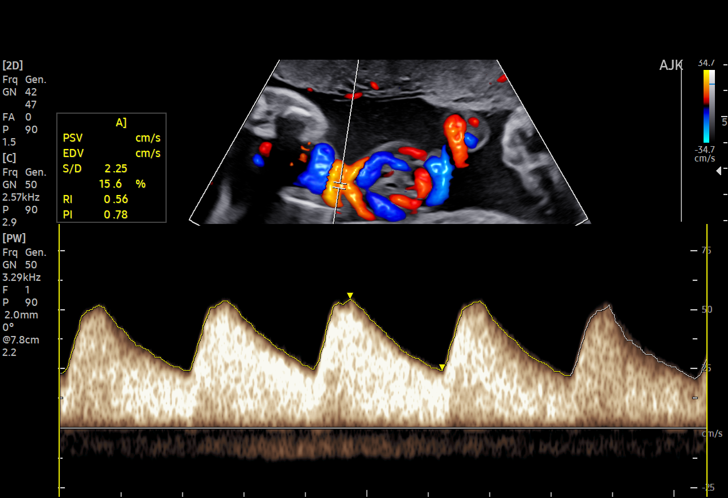
[im 32/42]
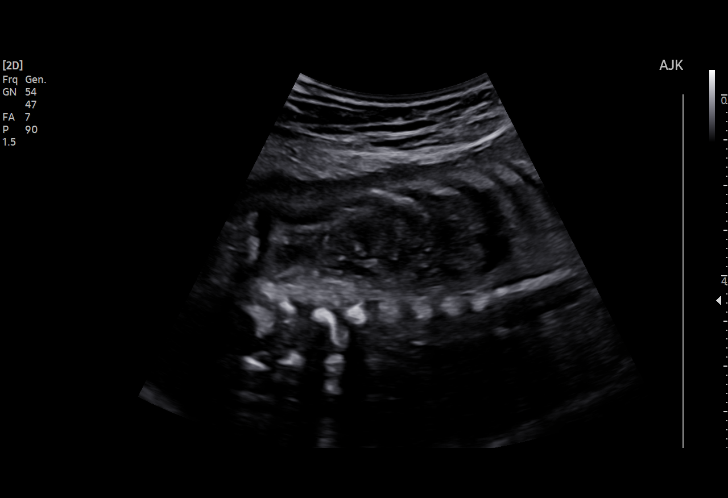
[im 35/42]
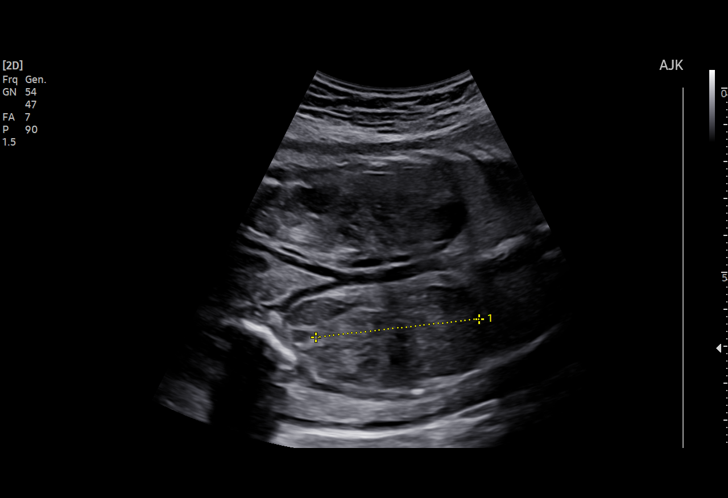
[im 38/42]
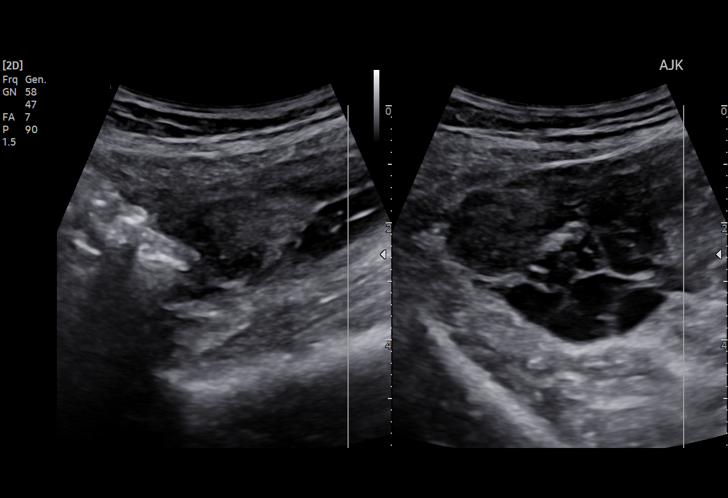
[im 42/42]
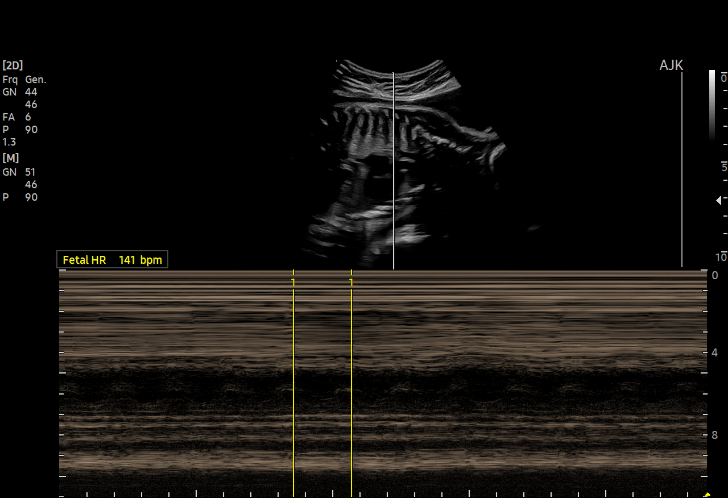

[14 of 28 positions shown; findings below may reference images not displayed]

Indications

 Maternal care for known or suspected poor
 fetal growth, third trimester, not applicable or
 unspecified IUGR
 30 weeks gestation of pregnancy
 Cystic Fibrosis (CF) Carrier, third trimester
 Genetic carrier ([place])
 LR Nips/Neg AFP
 Rh negative state in antepartum
 Previous cesarean delivery, antepartum (x1)
Fetal Evaluation

 Num Of Fetuses:         1
 Fetal Heart Rate(bpm):  141
 Cardiac Activity:       Observed
 Presentation:           Transverse, head to maternal left
 Placenta:               Anterior
 P. Cord Insertion:      Visualized

 Amniotic Fluid
 AFI FV:      Within normal limits

 AFI Sum(cm)     %Tile       Largest Pocket(cm)
 15.58           56

 RUQ(cm)       RLQ(cm)       LUQ(cm)        LLQ(cm)


 Comment:    Stomach, bladder, and diaphragm seen.
OB History

 Gravidity:    2         Term:   1
 Living:       1
Gestational Age

 LMP:           30w 3d        Date:  06/30/21                 EDD:   04/06/22
 Best:          30w 3d     Det. By:  LMP  (06/30/21)          EDD:   04/06/22
Doppler - Fetal Vessels

 Umbilical Artery
  S/D     %tile      RI    %tile      PI    %tile     PSV    ADFV    RDFV
                                                    (cm/s)
  2.67       42    0.63       47    0.[REDACTED]      No      No

Cervix Uterus Adnexa

 Cervix
 Length:            4.8  cm.
 Normal appearance by transabdominal scan.

 Uterus
 No abnormality visualized.

 Right Ovary
 Within normal limits.

 Left Ovary
 Within normal limits.

 Adnexa
 No abnormality visualized.
Comments

 This patient was seen due to IUGR.  She denies any
 problems since her last exam and reports feeling vigorous
 fetal movements throughout the day.
 The patient had a reactive NST for her gestational age today.
 There was normal amniotic fluid noted on today's exam.
 Doppler studies of the umbilical arteries showed a normal
 S/D ratio of 2.67.  There were no signs of absent or reversed
 end-diastolic flow.
 She will return in 1 week for another umbilical artery Doppler
 study, NST, and growth scan.

## 2022-01-29 NOTE — Procedures (Signed)
Gina Pearson 03/28/1996 [redacted]w[redacted]d Fetus A Non-Stress Test Interpretation for 01/29/22  Indication:  FGR  Fetal Heart Rate A Mode: External Baseline Rate (A): 140 bpm Variability: Moderate Accelerations: 15 x 15 Decelerations: Prolonged (FHR decreased to 120's -130's x 3 min. Fetus very active.) Multiple birth?: No  Uterine Activity Mode: Palpation, Toco Contraction Frequency (min): none Resting Tone Palpated: Relaxed  Interpretation (Fetal Testing) Nonstress Test Interpretation: Reactive Overall Impression: Reassuring for gestational age Comments: Dr. FAnnamaria Bootsreviewd tracing

## 2022-02-02 ENCOUNTER — Telehealth: Payer: Self-pay

## 2022-02-02 ENCOUNTER — Encounter: Payer: No Typology Code available for payment source | Admitting: Certified Nurse Midwife

## 2022-02-02 NOTE — Telephone Encounter (Signed)
Pt called stating she has stomach bug symptoms and diarrhea. Appt canceled here today and pt was told to see PCP. Pt will call office to reschedule when she feels better.

## 2022-02-05 ENCOUNTER — Ambulatory Visit: Payer: No Typology Code available for payment source | Attending: Obstetrics and Gynecology

## 2022-02-05 ENCOUNTER — Ambulatory Visit: Payer: No Typology Code available for payment source | Admitting: *Deleted

## 2022-02-05 ENCOUNTER — Encounter: Payer: Self-pay | Admitting: *Deleted

## 2022-02-05 ENCOUNTER — Ambulatory Visit (HOSPITAL_BASED_OUTPATIENT_CLINIC_OR_DEPARTMENT_OTHER): Payer: No Typology Code available for payment source | Admitting: *Deleted

## 2022-02-05 VITALS — BP 124/64 | HR 86

## 2022-02-05 DIAGNOSIS — Z6791 Unspecified blood type, Rh negative: Secondary | ICD-10-CM

## 2022-02-05 DIAGNOSIS — O365930 Maternal care for other known or suspected poor fetal growth, third trimester, not applicable or unspecified: Secondary | ICD-10-CM | POA: Diagnosis present

## 2022-02-05 DIAGNOSIS — Z3A31 31 weeks gestation of pregnancy: Secondary | ICD-10-CM

## 2022-02-05 DIAGNOSIS — Z141 Cystic fibrosis carrier: Secondary | ICD-10-CM | POA: Insufficient documentation

## 2022-02-05 DIAGNOSIS — O285 Abnormal chromosomal and genetic finding on antenatal screening of mother: Secondary | ICD-10-CM

## 2022-02-05 DIAGNOSIS — O26893 Other specified pregnancy related conditions, third trimester: Secondary | ICD-10-CM | POA: Insufficient documentation

## 2022-02-05 DIAGNOSIS — Z3480 Encounter for supervision of other normal pregnancy, unspecified trimester: Secondary | ICD-10-CM | POA: Insufficient documentation

## 2022-02-05 DIAGNOSIS — O34219 Maternal care for unspecified type scar from previous cesarean delivery: Secondary | ICD-10-CM | POA: Insufficient documentation

## 2022-02-05 DIAGNOSIS — O360190 Maternal care for anti-D [Rh] antibodies, unspecified trimester, not applicable or unspecified: Secondary | ICD-10-CM

## 2022-02-05 IMAGING — US US MFM UA CORD DOPPLER
1 series · 13 of 28 positions shown · non-contrast
Comparison: none

[Series 1: us mfm ua cord doppler · 35 acquisitions, 13 frames shown]
[im 2/35]
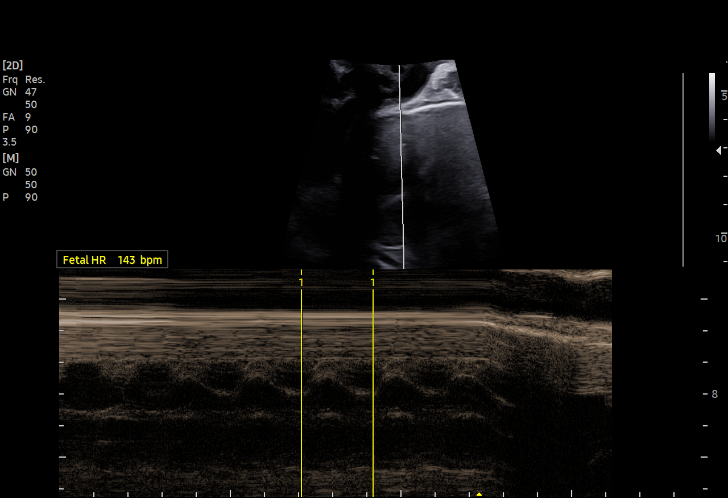
[im 4/35]
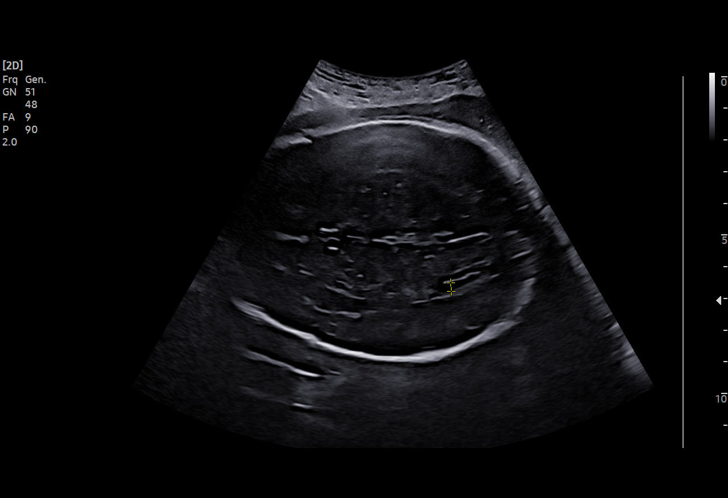
[im 7/35]
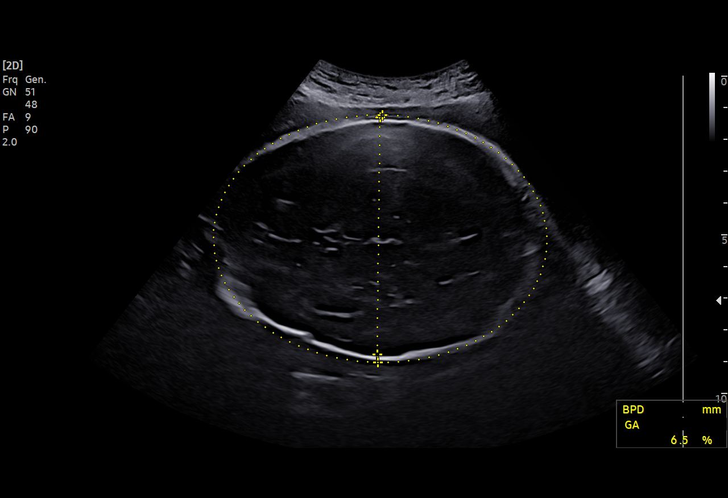
[im 9/35]
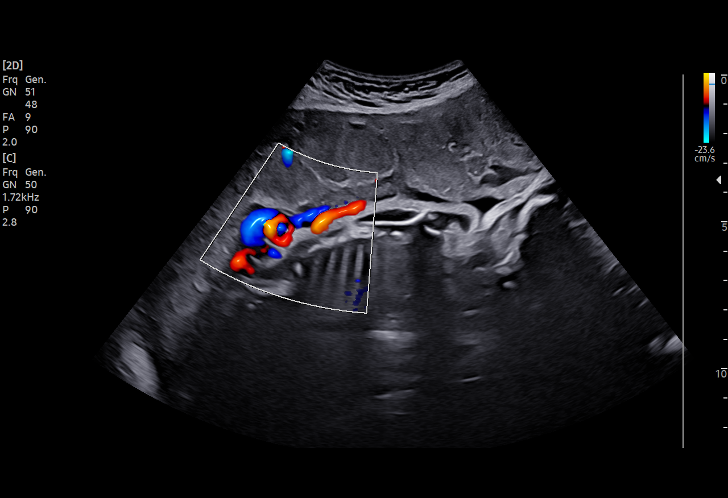
[im 12/35]
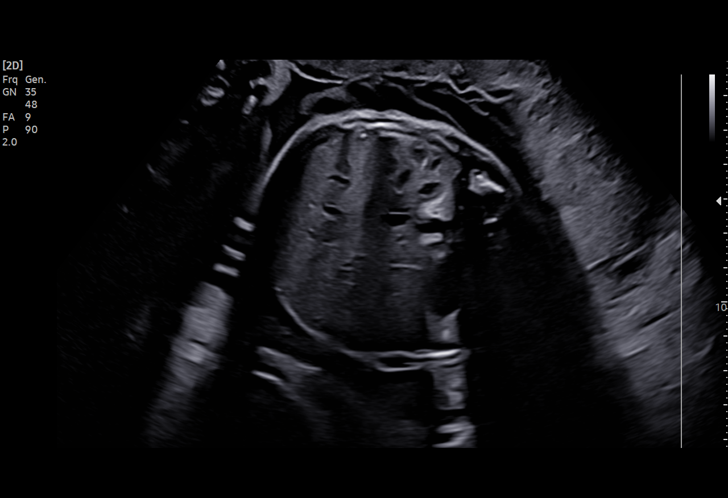
[im 14/35]
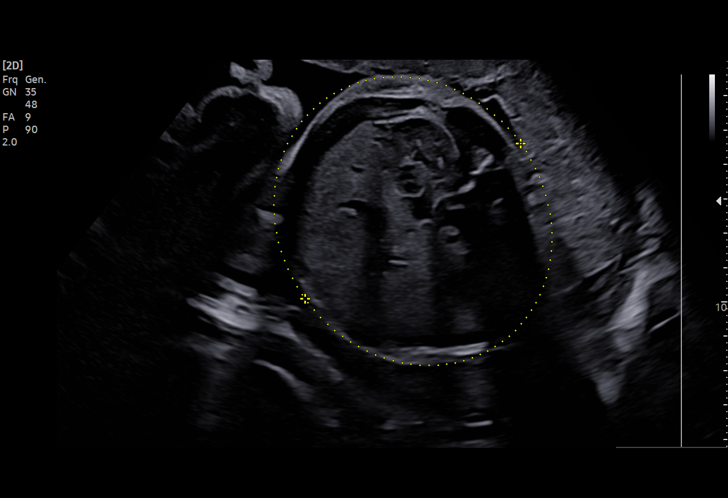
[im 18/35]
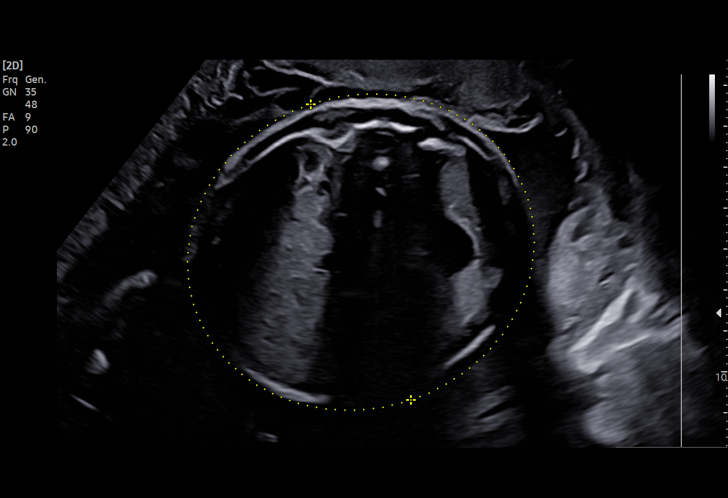
[im 21/35]
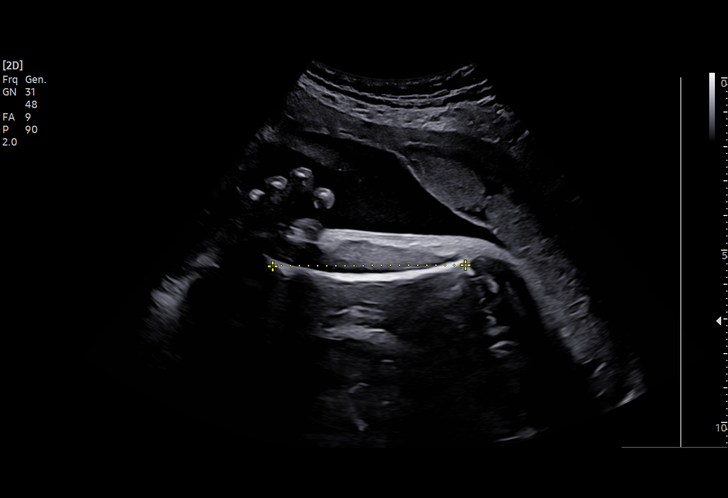
[im 23/35]
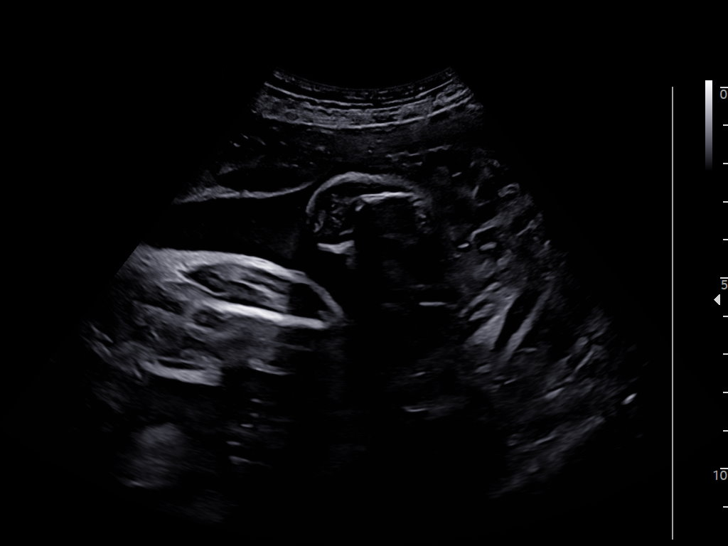
[im 26/35]
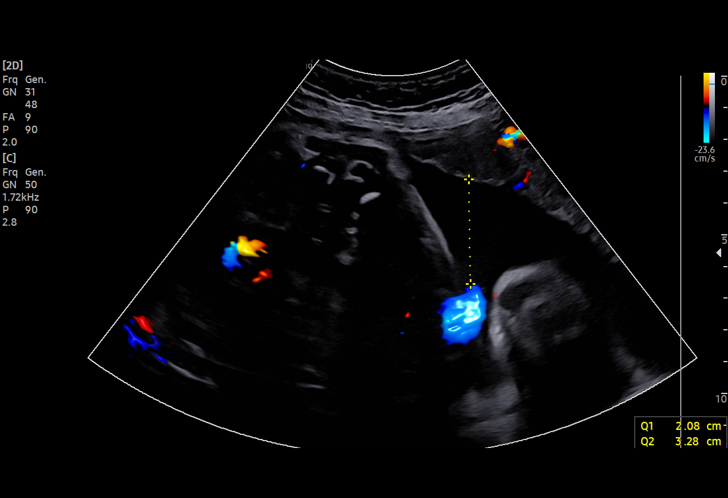
[im 28/35]
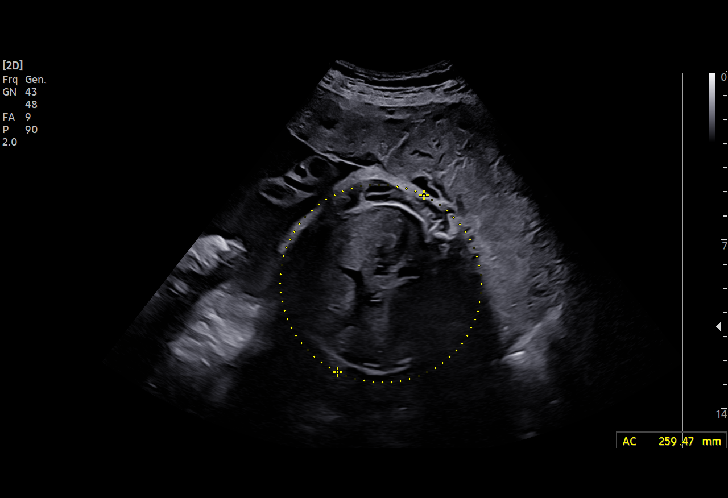
[im 31/35]
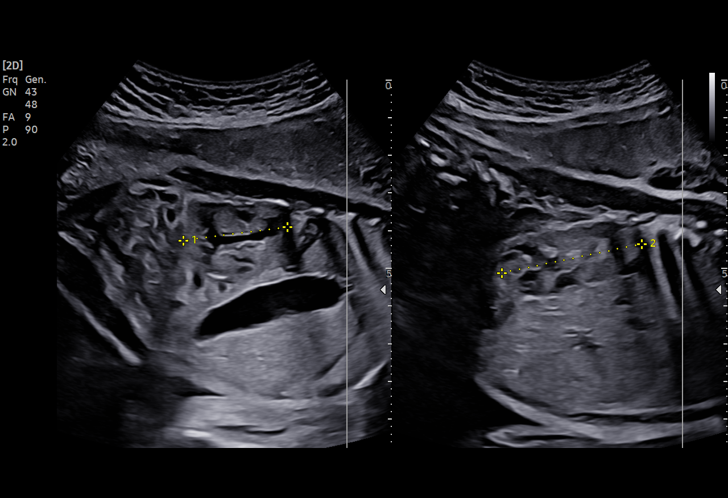
[im 33/35]
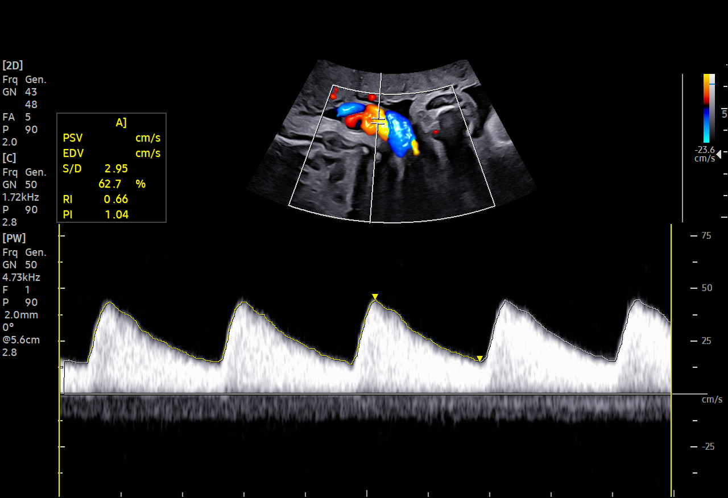

[13 of 28 positions shown; findings below may reference images not displayed]

Indications

 Maternal care for known or suspected poor
 fetal growth, third trimester, not applicable or
 unspecified IUGR
 Cystic Fibrosis (CF) Carrier, third trimester
 Genetic carrier ([place])
 LR Nips/Neg AFP
 Rh negative state in antepartum
 31 weeks gestation of pregnancy
 Previous cesarean delivery, antepartum (x1)
Fetal Evaluation

 Num Of Fetuses:         1
 Fetal Heart Rate(bpm):  143
 Cardiac Activity:       Observed
 Presentation:           Cephalic
 Placenta:               Anterior
 P. Cord Insertion:      Previously Visualized

 Amniotic Fluid
 AFI FV:      Within normal limits

 AFI Sum(cm)     %Tile       Largest Pocket(cm)
 11.94           31

 RUQ(cm)       RLQ(cm)       LUQ(cm)        LLQ(cm)

Biometry

 BPD:     74.59  mm     G. Age:  29w 6d          7  %    CI:         68.5   %    70 - 86
                                                         FL/HC:      19.6   %    19.3 -
 HC:    288.07   mm     G. Age:  31w 5d         20  %    HC/AC:      1.11        0.96 -
 AC:    259.11   mm     G. Age:  30w 0d         13  %    FL/BPD:     75.6   %    71 - 87
 FL:       56.4  mm     G. Age:  29w 4d        4.4  %    FL/AC:      21.8   %    20 - 24
 LV:        2.7  mm

 Est. FW:    4257  gm      3 lb 5 oz      8  %
OB History

 Gravidity:    2         Term:   1
 Living:       1
Gestational Age

 LMP:           31w 3d        Date:  06/30/21                 EDD:   04/06/22
 U/S Today:     30w 2d                                        EDD:   04/14/22
 Best:          31w 3d     Det. By:  LMP  (06/30/21)          EDD:   04/06/22
Anatomy

 Cranium:               Appears normal         LVOT:                   Previously seen
 Cavum:                 Previously seen        Aortic Arch:            Previously seen
 Ventricles:            Appears normal         Ductal Arch:            Previously seen
 Choroid Plexus:        Previously seen        Diaphragm:              Previously seen
 Cerebellum:            Previously seen        Stomach:                Appears normal, left
                                                                       sided
 Posterior Fossa:       Previously seen        Abdomen:                Appears normal
 Nuchal Fold:           Not applicable (>20    Abdominal Wall:         Previously seen
                        wks GA)
 Face:                  Orbits and profile     Cord Vessels:           Previously seen
                        previously seen
 Lips:                  Previously seen        Kidneys:                Appear normal
 Palate:                Appears normal         Bladder:                Previously seen
 Thoracic:              Previously seen        Spine:                  Previously seen
 Heart:                 Previously seen        Upper Extremities:      Previously seen
 RVOT:                  Previously seen        Lower Extremities:      Previously seen

 Other:  VC, 3VV and 3VTV previously visualized.
Doppler - Fetal Vessels

 Umbilical Artery
  S/D     %tile      RI    %tile      PI    %tile     PSV    ADFV    RDFV
                                                    (cm/s)
   2.9       60    0.65       63    1.[REDACTED]      No      No

Comments
 This patient was seen for a follow up growth scan due to
 IUGR.  She denies any problems since her last exam and
 reports feeling vigorous fetal movements throughout the day.
 On today's exam, the EFW measures at the 8th percentile for
 her gestational age.  The fetus has grown 1 pound over the
 past 3 weeks.  There was normal amniotic fluid noted.
 The patient had a reactive NST today.
 Doppler studies of the umbilical arteries showed a normal
 S/D ratio of 2.9.  There were no signs of absent or reversed
 end-diastolic flow.
 She will return in 1 week for another BPP and umbilical artery
 Doppler study.

## 2022-02-05 NOTE — Procedures (Signed)
Gina Pearson Oct 28, 1995 [redacted]w[redacted]d Fetus A Non-Stress Test Interpretation for 02/05/22  Indication: IUGR  Fetal Heart Rate A Mode: External Baseline Rate (A): 140 bpm Variability: Moderate Accelerations: 15 x 15 Decelerations: None Multiple birth?: No  Uterine Activity Mode: Palpation, Toco Contraction Frequency (min): none Resting Tone Palpated: Relaxed  Interpretation (Fetal Testing) Nonstress Test Interpretation: Reactive Overall Impression: Reassuring for gestational age Comments: Dr. FAnnamaria Bootsreviewed tracing

## 2022-02-12 ENCOUNTER — Other Ambulatory Visit: Payer: Self-pay | Admitting: *Deleted

## 2022-02-12 ENCOUNTER — Encounter: Payer: Self-pay | Admitting: *Deleted

## 2022-02-12 ENCOUNTER — Ambulatory Visit: Payer: No Typology Code available for payment source | Attending: Obstetrics

## 2022-02-12 ENCOUNTER — Ambulatory Visit: Payer: No Typology Code available for payment source | Admitting: *Deleted

## 2022-02-12 VITALS — BP 112/64 | HR 88

## 2022-02-12 DIAGNOSIS — O26893 Other specified pregnancy related conditions, third trimester: Secondary | ICD-10-CM | POA: Insufficient documentation

## 2022-02-12 DIAGNOSIS — Z6791 Unspecified blood type, Rh negative: Secondary | ICD-10-CM | POA: Diagnosis not present

## 2022-02-12 DIAGNOSIS — Z3A32 32 weeks gestation of pregnancy: Secondary | ICD-10-CM | POA: Diagnosis not present

## 2022-02-12 DIAGNOSIS — O360130 Maternal care for anti-D [Rh] antibodies, third trimester, not applicable or unspecified: Secondary | ICD-10-CM

## 2022-02-12 DIAGNOSIS — O365930 Maternal care for other known or suspected poor fetal growth, third trimester, not applicable or unspecified: Secondary | ICD-10-CM | POA: Diagnosis present

## 2022-02-12 DIAGNOSIS — D563 Thalassemia minor: Secondary | ICD-10-CM

## 2022-02-12 DIAGNOSIS — Z141 Cystic fibrosis carrier: Secondary | ICD-10-CM | POA: Diagnosis not present

## 2022-02-12 DIAGNOSIS — Z3480 Encounter for supervision of other normal pregnancy, unspecified trimester: Secondary | ICD-10-CM | POA: Insufficient documentation

## 2022-02-12 DIAGNOSIS — O09893 Supervision of other high risk pregnancies, third trimester: Secondary | ICD-10-CM | POA: Diagnosis not present

## 2022-02-12 DIAGNOSIS — O34219 Maternal care for unspecified type scar from previous cesarean delivery: Secondary | ICD-10-CM | POA: Diagnosis not present

## 2022-02-12 DIAGNOSIS — O285 Abnormal chromosomal and genetic finding on antenatal screening of mother: Secondary | ICD-10-CM

## 2022-02-19 ENCOUNTER — Ambulatory Visit: Attending: Obstetrics

## 2022-02-19 ENCOUNTER — Encounter: Payer: Self-pay | Admitting: *Deleted

## 2022-02-19 ENCOUNTER — Ambulatory Visit: Admitting: *Deleted

## 2022-02-19 VITALS — BP 117/67 | HR 92

## 2022-02-19 DIAGNOSIS — Z3A33 33 weeks gestation of pregnancy: Secondary | ICD-10-CM | POA: Diagnosis not present

## 2022-02-19 DIAGNOSIS — Z141 Cystic fibrosis carrier: Secondary | ICD-10-CM

## 2022-02-19 DIAGNOSIS — D563 Thalassemia minor: Secondary | ICD-10-CM

## 2022-02-19 DIAGNOSIS — Z3480 Encounter for supervision of other normal pregnancy, unspecified trimester: Secondary | ICD-10-CM

## 2022-02-19 DIAGNOSIS — O34219 Maternal care for unspecified type scar from previous cesarean delivery: Secondary | ICD-10-CM | POA: Diagnosis not present

## 2022-02-19 DIAGNOSIS — Z6791 Unspecified blood type, Rh negative: Secondary | ICD-10-CM

## 2022-02-19 DIAGNOSIS — Z148 Genetic carrier of other disease: Secondary | ICD-10-CM | POA: Diagnosis not present

## 2022-02-19 DIAGNOSIS — O365930 Maternal care for other known or suspected poor fetal growth, third trimester, not applicable or unspecified: Secondary | ICD-10-CM | POA: Insufficient documentation

## 2022-02-19 DIAGNOSIS — O360130 Maternal care for anti-D [Rh] antibodies, third trimester, not applicable or unspecified: Secondary | ICD-10-CM

## 2022-02-19 DIAGNOSIS — O26893 Other specified pregnancy related conditions, third trimester: Secondary | ICD-10-CM | POA: Insufficient documentation

## 2022-02-19 DIAGNOSIS — O09893 Supervision of other high risk pregnancies, third trimester: Secondary | ICD-10-CM | POA: Insufficient documentation

## 2022-02-19 DIAGNOSIS — O285 Abnormal chromosomal and genetic finding on antenatal screening of mother: Secondary | ICD-10-CM | POA: Diagnosis not present

## 2022-02-26 ENCOUNTER — Ambulatory Visit: Payer: No Typology Code available for payment source | Attending: Obstetrics

## 2022-02-26 ENCOUNTER — Encounter: Payer: Self-pay | Admitting: *Deleted

## 2022-02-26 ENCOUNTER — Ambulatory Visit: Payer: No Typology Code available for payment source | Admitting: *Deleted

## 2022-02-26 VITALS — BP 115/67 | HR 92

## 2022-02-26 DIAGNOSIS — Z141 Cystic fibrosis carrier: Secondary | ICD-10-CM | POA: Insufficient documentation

## 2022-02-26 DIAGNOSIS — O26893 Other specified pregnancy related conditions, third trimester: Secondary | ICD-10-CM | POA: Insufficient documentation

## 2022-02-26 DIAGNOSIS — Z3A34 34 weeks gestation of pregnancy: Secondary | ICD-10-CM | POA: Diagnosis not present

## 2022-02-26 DIAGNOSIS — Z6791 Unspecified blood type, Rh negative: Secondary | ICD-10-CM | POA: Insufficient documentation

## 2022-02-26 DIAGNOSIS — O09893 Supervision of other high risk pregnancies, third trimester: Secondary | ICD-10-CM | POA: Diagnosis not present

## 2022-02-26 DIAGNOSIS — Z3480 Encounter for supervision of other normal pregnancy, unspecified trimester: Secondary | ICD-10-CM

## 2022-02-26 DIAGNOSIS — O365930 Maternal care for other known or suspected poor fetal growth, third trimester, not applicable or unspecified: Secondary | ICD-10-CM | POA: Diagnosis present

## 2022-02-26 DIAGNOSIS — D563 Thalassemia minor: Secondary | ICD-10-CM | POA: Diagnosis not present

## 2022-02-26 DIAGNOSIS — Z148 Genetic carrier of other disease: Secondary | ICD-10-CM | POA: Diagnosis not present

## 2022-02-26 DIAGNOSIS — O34219 Maternal care for unspecified type scar from previous cesarean delivery: Secondary | ICD-10-CM | POA: Diagnosis not present

## 2022-02-26 DIAGNOSIS — O360130 Maternal care for anti-D [Rh] antibodies, third trimester, not applicable or unspecified: Secondary | ICD-10-CM

## 2022-03-03 NOTE — Progress Notes (Unsigned)
   PRENATAL VISIT NOTE  Subjective:  Gina Pearson is a 26 y.o. G2P1001 at 46w3dbeing seen today for ongoing prenatal care.  She is currently monitored for the following issues for this high-risk pregnancy and has Supervision of other normal pregnancy, antepartum; Pseudoseizure; Blood type, Rh negative; Allergic rhinitis, unspecified; Borderline personality disorder ([place]; Depressive disorder; Recurrent urinary tract infection; Acute stress reaction; Superficial (introital) dyspareunia; Ganglion, left wrist; Hemorrhoid; Low grade squamous intraepithelial lesion on cytologic smear of cervix (LGSIL); Migraine, unspecified, not intractable, without status migrainosus; Mixed emotional features as adjustment reaction; 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  Patient reports  round ligaments pain and occasional sensations that she thinks are ctxns .  Contractions: Not present. Vag. Bleeding: None.  Movement: Present. Denies leaking of fluid.   The following portions of the patient's history were reviewed and updated as appropriate: allergies, current medications, past family history, past medical history, past social history, past surgical history and problem list.   Objective:   Vitals:   03/05/22 1401  BP: 125/76  Pulse: 99  Weight: 149 lb (67.6 kg)    Fetal Status: Fetal Heart Rate (bpm): 145 Fundal Height: 34 cm Movement: Present     General:  Alert, oriented and cooperative. Patient is in no acute distress.  Skin: Skin is warm and dry. No rash noted.   Cardiovascular: Normal heart rate noted  Respiratory: Normal respiratory effort, no problems with respiration noted  Abdomen: Soft, gravid, appropriate for gestational age.  Pain/Pressure: Absent     Pelvic: Cervical exam deferred        Extremities: Normal range of motion.  Edema: None  Mental Status: Normal mood and  affect. Normal behavior. Normal judgment and thought content.   Assessment and Plan:  Pregnancy: G2P1001 at 377w3d. Blood type, Rh negative S/p Rhogam  2. Supervision of other normal pregnancy, antepartum UTI on 6/4 - check TOC  Previously declined tdap but was going to consider. Offered today - pt declines until postpartum  3. History of cesarean section Reviewed options last time and given consent to review. She would like TOLAC. Consent signed today.   4. 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 Fetal growth restriction antepartum Improved to 14%ile and AC 20%ile. She is for next growth on 7/31. Normal dopplers and antenatal testing.   Preterm labor symptoms and general obstetric precautions including but not limited to vaginal bleeding, contractions, leaking of fluid and fetal movement were reviewed in detail with the patient. Please refer to After Visit Summary for other counseling recommendations.   Return in about 1 week (around 03/12/2022) for OB VISIT, MD or APP.  Future Appointments  Date Time Provider De[place]7/31/2023  1:30 PM [facility]7/31/2023  1:45 PM [facility] US6 [facility] W[facility]    PaRadene GunningMD

## 2022-03-05 ENCOUNTER — Encounter: Payer: Self-pay | Admitting: Obstetrics and Gynecology

## 2022-03-05 ENCOUNTER — Ambulatory Visit: Payer: No Typology Code available for payment source

## 2022-03-05 ENCOUNTER — Ambulatory Visit (INDEPENDENT_AMBULATORY_CARE_PROVIDER_SITE_OTHER): Payer: No Typology Code available for payment source | Admitting: Obstetrics and Gynecology

## 2022-03-05 VITALS — BP 125/76 | HR 99 | Wt 149.0 lb

## 2022-03-05 DIAGNOSIS — R87612 Low grade squamous intraepithelial lesion on cytologic smear of cervix (LGSIL): Secondary | ICD-10-CM

## 2022-03-05 DIAGNOSIS — Z3483 Encounter for supervision of other normal pregnancy, third trimester: Secondary | ICD-10-CM

## 2022-03-05 DIAGNOSIS — Z3480 Encounter for supervision of other normal pregnancy, unspecified trimester: Secondary | ICD-10-CM

## 2022-03-05 DIAGNOSIS — Z6791 Unspecified blood type, Rh negative: Secondary | ICD-10-CM

## 2022-03-05 DIAGNOSIS — Z3A35 35 weeks gestation of pregnancy: Secondary | ICD-10-CM

## 2022-03-05 DIAGNOSIS — Z98891 History of uterine scar from previous surgery: Secondary | ICD-10-CM

## 2022-03-05 DIAGNOSIS — O365990 Maternal care for other known or suspected poor fetal growth, unspecified trimester, not applicable or unspecified: Secondary | ICD-10-CM

## 2022-03-08 LAB — URINE CULTURE, OB REFLEX

## 2022-03-08 LAB — CULTURE, OB URINE

## 2022-03-12 ENCOUNTER — Ambulatory Visit: Payer: No Typology Code available for payment source

## 2022-03-13 ENCOUNTER — Other Ambulatory Visit (HOSPITAL_COMMUNITY)
Admission: RE | Admit: 2022-03-13 | Discharge: 2022-03-13 | Disposition: A | Discharge status: Home / Self Care | Payer: No Typology Code available for payment source | Source: Ambulatory Visit | Attending: Obstetrics and Gynecology | Admitting: Obstetrics and Gynecology

## 2022-03-13 ENCOUNTER — Ambulatory Visit (INDEPENDENT_AMBULATORY_CARE_PROVIDER_SITE_OTHER): Payer: No Typology Code available for payment source | Admitting: Obstetrics and Gynecology

## 2022-03-13 VITALS — BP 109/72 | HR 96 | Wt 150.0 lb

## 2022-03-13 DIAGNOSIS — O365990 Maternal care for other known or suspected poor fetal growth, unspecified trimester, not applicable or unspecified: Secondary | ICD-10-CM

## 2022-03-13 DIAGNOSIS — Z3480 Encounter for supervision of other normal pregnancy, unspecified trimester: Secondary | ICD-10-CM | POA: Diagnosis present

## 2022-03-13 LAB — OB RESULTS CONSOLE GBS: GBS: NEGATIVE

## 2022-03-13 NOTE — Progress Notes (Signed)
   PRENATAL VISIT NOTE  Subjective:  Gina Pearson is a 26 y.o. G2P1001 at 51w4dbeing seen today for ongoing prenatal care.  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  Patient reports no complaints.  Contractions: Not present. Vag. Bleeding: None.  Movement: Present. Denies leaking of fluid.   The following portions of the patient's history were reviewed and updated as appropriate: allergies, current medications, past family history, past medical history, past social history, past surgical history and problem list.   Objective:   Vitals:   03/13/22 1006  BP: 109/72  Pulse: 96  Weight: 150 lb (68 kg)    Fetal Status: Fetal Heart Rate (bpm): 141   Movement: Present     General:  Alert, oriented and cooperative. Patient is in no acute distress.  Skin: Skin is warm and dry. No rash noted.   Cardiovascular: Normal heart rate noted  Respiratory: Normal respiratory effort, no problems with respiration noted  Abdomen: Soft, gravid, appropriate for gestational age.  Pain/Pressure: Absent     Pelvic: Cervical exam deferred        Extremities: Normal range of motion.  Edema: None  Mental Status: Normal mood and affect. Normal behavior. Normal judgment and thought content.   Assessment and  Plan:  Pregnancy: G2P1001 at 345w4d. Supervision of other normal pregnancy, antepartum  - Declined PAP today, given her history of abnormal paps, she would like to wait until PP - Culture, beta strep (group b only) - Cervicovaginal ancillary only( [place] 2. Fetal growth restriction antepartum  Continue MFM growth [place] She reports good fetal movement.   Preterm labor symptoms and general obstetric precautions including but not limited to vaginal bleeding, contractions, leaking of fluid and fetal movement were reviewed in detail with the patient. Please refer to After Visit Summary for other counseling recommendations.   No follow-ups on file.  Future Appointments  Date Time Provider DeEast [place]7/31/2023  1:30 PM [facility]URSE WMBrent[facility]7/31/2023  1:45 PM [facility] US6 [facility]MFCUS WMHooversvilleNP

## 2022-03-14 LAB — CERVICOVAGINAL ANCILLARY ONLY
Chlamydia: NEGATIVE
Comment: NEGATIVE
Comment: NORMAL
Neisseria Gonorrhea: NEGATIVE

## 2022-03-16 LAB — CULTURE, BETA STREP (GROUP B ONLY)
MICRO NUMBER:: 13695542
SPECIMEN QUALITY:: ADEQUATE

## 2022-03-19 ENCOUNTER — Ambulatory Visit: Payer: No Typology Code available for payment source | Attending: Maternal & Fetal Medicine

## 2022-03-19 ENCOUNTER — Ambulatory Visit: Payer: No Typology Code available for payment source | Admitting: *Deleted

## 2022-03-19 ENCOUNTER — Encounter: Payer: Self-pay | Admitting: *Deleted

## 2022-03-19 VITALS — BP 115/69 | HR 84

## 2022-03-19 DIAGNOSIS — Z141 Cystic fibrosis carrier: Secondary | ICD-10-CM | POA: Diagnosis not present

## 2022-03-19 DIAGNOSIS — D563 Thalassemia minor: Secondary | ICD-10-CM

## 2022-03-19 DIAGNOSIS — Z3A37 37 weeks gestation of pregnancy: Secondary | ICD-10-CM | POA: Diagnosis not present

## 2022-03-19 DIAGNOSIS — O09893 Supervision of other high risk pregnancies, third trimester: Secondary | ICD-10-CM | POA: Insufficient documentation

## 2022-03-19 DIAGNOSIS — Z6791 Unspecified blood type, Rh negative: Secondary | ICD-10-CM | POA: Insufficient documentation

## 2022-03-19 DIAGNOSIS — O34219 Maternal care for unspecified type scar from previous cesarean delivery: Secondary | ICD-10-CM | POA: Insufficient documentation

## 2022-03-19 DIAGNOSIS — O26893 Other specified pregnancy related conditions, third trimester: Secondary | ICD-10-CM | POA: Insufficient documentation

## 2022-03-19 DIAGNOSIS — Z148 Genetic carrier of other disease: Secondary | ICD-10-CM | POA: Insufficient documentation

## 2022-03-19 DIAGNOSIS — O360130 Maternal care for anti-D [Rh] antibodies, third trimester, not applicable or unspecified: Secondary | ICD-10-CM | POA: Diagnosis not present

## 2022-03-19 DIAGNOSIS — Z3480 Encounter for supervision of other normal pregnancy, unspecified trimester: Secondary | ICD-10-CM | POA: Insufficient documentation

## 2022-03-19 DIAGNOSIS — O285 Abnormal chromosomal and genetic finding on antenatal screening of mother: Secondary | ICD-10-CM

## 2022-03-19 DIAGNOSIS — O365930 Maternal care for other known or suspected poor fetal growth, third trimester, not applicable or unspecified: Secondary | ICD-10-CM | POA: Insufficient documentation

## 2022-03-20 ENCOUNTER — Encounter (HOSPITAL_COMMUNITY): Payer: Self-pay | Admitting: *Deleted

## 2022-03-20 ENCOUNTER — Telehealth (HOSPITAL_COMMUNITY): Payer: Self-pay | Admitting: *Deleted

## 2022-03-20 ENCOUNTER — Ambulatory Visit (INDEPENDENT_AMBULATORY_CARE_PROVIDER_SITE_OTHER): Payer: No Typology Code available for payment source | Admitting: Obstetrics and Gynecology

## 2022-03-20 VITALS — BP 118/75 | HR 77 | Wt 151.0 lb

## 2022-03-20 DIAGNOSIS — O365990 Maternal care for other known or suspected poor fetal growth, unspecified trimester, not applicable or unspecified: Secondary | ICD-10-CM

## 2022-03-20 DIAGNOSIS — O365931 Maternal care for other known or suspected poor fetal growth, third trimester, fetus 1: Secondary | ICD-10-CM

## 2022-03-20 DIAGNOSIS — Z3480 Encounter for supervision of other normal pregnancy, unspecified trimester: Secondary | ICD-10-CM

## 2022-03-20 DIAGNOSIS — Z3A37 37 weeks gestation of pregnancy: Secondary | ICD-10-CM

## 2022-03-20 NOTE — Telephone Encounter (Signed)
Preadmission screen  

## 2022-03-20 NOTE — Patient Instructions (Signed)

## 2022-03-20 NOTE — Progress Notes (Signed)
   PRENATAL VISIT NOTE  Subjective:  Gina Pearson is a 26 y.o. G2P1001 at 68w4dbeing seen today for ongoing prenatal care.  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  Patient reports no complaints.  Contractions: Not present. Vag. Bleeding: None.  Movement: Present. Denies leaking of fluid.   The following portions of the patient's history were reviewed and updated as appropriate: allergies, current medications, past family history, past medical history, past social history, past surgical history and problem list.   Objective:   Vitals:   03/20/22 0951  BP: 118/75  Pulse: 77  Weight: 151 lb (68.5 kg)    Fetal Status: Fetal Heart Rate (bpm): 140   Movement: Present     General:  Alert, oriented and cooperative. Patient is in no acute distress.  Skin: Skin is warm and dry. No rash noted.   Cardiovascular: Normal heart rate noted  Respiratory: Normal respiratory effort, no problems with respiration noted  Abdomen: Soft, gravid, appropriate for gestational age.  Pain/Pressure: Absent     Pelvic: Cervical exam deferred        Extremities: Normal range of motion.  Edema: None  Mental Status: Normal mood and affect. Normal behavior. Normal judgment and thought content.   Assessment and Plan:  Pregnancy:  G2P1001 at 394w4d1. Fetal growth restriction antepartum  Per MFM- induction between 38/39 weeks- growth now 7% BPP normal this week with normal dopplers Good fetal movement per patient.   2. Supervision of other normal pregnancy, antepartum  GBS negative  [facility]ent signed.    Term labor symptoms and general obstetric precautions including but not limited to vaginal bleeding, contractions, leaking of fluid and fetal movement were reviewed in detail with the patient. Please refer to After Visit Summary for other counseling recommendations.   Return Needs to start twice weekly testing in our office next week..  Future Appointments  Date Time Provider De[place]8/03/2022  2:30 PM Hiliana Eilts, JeArtist PaisNP [facility]Va [facility]04/05/2022 10:10 AM Constant, PeVickii ChafeMD CWHaleburgNP

## 2022-03-21 ENCOUNTER — Other Ambulatory Visit: Payer: Self-pay | Admitting: Advanced Practice Midwife

## 2022-03-22 ENCOUNTER — Encounter: Payer: No Typology Code available for payment source | Admitting: Obstetrics and Gynecology

## 2022-03-25 ENCOUNTER — Other Ambulatory Visit: Payer: Self-pay

## 2022-03-25 ENCOUNTER — Encounter (HOSPITAL_COMMUNITY): Payer: Self-pay | Admitting: Family Medicine

## 2022-03-25 ENCOUNTER — Inpatient Hospital Stay (HOSPITAL_COMMUNITY): Payer: No Typology Code available for payment source

## 2022-03-25 ENCOUNTER — Inpatient Hospital Stay (HOSPITAL_COMMUNITY)
Admission: AD | Admit: 2022-03-25 | Discharge: 2022-03-29 | DRG: 787 | Disposition: A | Discharge status: Home / Self Care | Payer: No Typology Code available for payment source | Attending: Family Medicine | Admitting: Family Medicine

## 2022-03-25 DIAGNOSIS — R569 Unspecified convulsions: Secondary | ICD-10-CM | POA: Diagnosis not present

## 2022-03-25 DIAGNOSIS — O365930 Maternal care for other known or suspected poor fetal growth, third trimester, not applicable or unspecified: Principal | ICD-10-CM | POA: Diagnosis present

## 2022-03-25 DIAGNOSIS — O34211 Maternal care for low transverse scar from previous cesarean delivery: Secondary | ICD-10-CM | POA: Diagnosis present

## 2022-03-25 DIAGNOSIS — Z3480 Encounter for supervision of other normal pregnancy, unspecified trimester: Secondary | ICD-10-CM

## 2022-03-25 DIAGNOSIS — O99354 Diseases of the nervous system complicating childbirth: Secondary | ICD-10-CM | POA: Diagnosis not present

## 2022-03-25 DIAGNOSIS — O26893 Other specified pregnancy related conditions, third trimester: Secondary | ICD-10-CM | POA: Diagnosis present

## 2022-03-25 DIAGNOSIS — D509 Iron deficiency anemia, unspecified: Secondary | ICD-10-CM

## 2022-03-25 DIAGNOSIS — O9081 Anemia of the puerperium: Secondary | ICD-10-CM | POA: Diagnosis not present

## 2022-03-25 DIAGNOSIS — Z98891 History of uterine scar from previous surgery: Secondary | ICD-10-CM

## 2022-03-25 DIAGNOSIS — Z3A38 38 weeks gestation of pregnancy: Secondary | ICD-10-CM | POA: Diagnosis not present

## 2022-03-25 DIAGNOSIS — O9902 Anemia complicating childbirth: Secondary | ICD-10-CM | POA: Diagnosis not present

## 2022-03-25 DIAGNOSIS — R87612 Low grade squamous intraepithelial lesion on cytologic smear of cervix (LGSIL): Secondary | ICD-10-CM | POA: Diagnosis present

## 2022-03-25 DIAGNOSIS — O365990 Maternal care for other known or suspected poor fetal growth, unspecified trimester, not applicable or unspecified: Secondary | ICD-10-CM | POA: Diagnosis present

## 2022-03-25 DIAGNOSIS — D649 Anemia, unspecified: Secondary | ICD-10-CM | POA: Diagnosis not present

## 2022-03-25 DIAGNOSIS — D62 Acute posthemorrhagic anemia: Secondary | ICD-10-CM | POA: Diagnosis not present

## 2022-03-25 DIAGNOSIS — Z6791 Unspecified blood type, Rh negative: Secondary | ICD-10-CM

## 2022-03-25 LAB — CBC
HCT: 28 % — ABNORMAL LOW (ref 36.0–46.0)
Hemoglobin: 8.7 g/dL — ABNORMAL LOW (ref 12.0–15.0)
MCH: 23.8 pg — ABNORMAL LOW (ref 26.0–34.0)
MCHC: 31.1 g/dL (ref 30.0–36.0)
MCV: 76.7 fL — ABNORMAL LOW (ref 80.0–100.0)
Platelets: 337 10*3/uL (ref 150–400)
RBC: 3.65 MIL/uL — ABNORMAL LOW (ref 3.87–5.11)
RDW: 15 % (ref 11.5–15.5)
WBC: 10.2 10*3/uL (ref 4.0–10.5)
nRBC: 0 % (ref 0.0–0.2)

## 2022-03-25 MED ORDER — OXYTOCIN-SODIUM CHLORIDE 30-0.9 UT/500ML-% IV SOLN: 2.5000 [IU]/h | INTRAVENOUS | Status: DC

## 2022-03-25 MED ORDER — OXYTOCIN BOLUS FROM INFUSION: 333.0000 mL | Freq: Once | INTRAVENOUS | Status: DC

## 2022-03-25 MED ORDER — EPHEDRINE 5 MG/ML INJ: 10.0000 mg | INTRAVENOUS | Status: DC | PRN

## 2022-03-25 MED ORDER — LACTATED RINGERS IV SOLN
500.0000 mL | INTRAVENOUS | Status: DC | PRN
  Administered 2022-03-26: 500 mL via INTRAVENOUS

## 2022-03-25 MED ORDER — LACTATED RINGERS IV SOLN: INTRAVENOUS | Status: DC

## 2022-03-25 MED ORDER — OXYTOCIN-SODIUM CHLORIDE 30-0.9 UT/500ML-% IV SOLN
1.0000 m[IU]/min | INTRAVENOUS | Status: DC
  Administered 2022-03-25: 8 m[IU]/min via INTRAVENOUS
  Administered 2022-03-27: 2 m[IU]/min via INTRAVENOUS
  Administered 2022-03-27: 4 m[IU]/min via INTRAVENOUS
  Filled 2022-03-25: qty 500

## 2022-03-25 MED ORDER — ACETAMINOPHEN 325 MG PO TABS: 650.0000 mg | ORAL_TABLET | ORAL | Status: DC | PRN

## 2022-03-25 MED ORDER — FENTANYL-BUPIVACAINE-NACL 0.5-0.125-0.9 MG/250ML-% EP SOLN
12.0000 mL/h | EPIDURAL | Status: DC | PRN
  Administered 2022-03-26 – 2022-03-27 (×2): 12 mL/h via EPIDURAL
  Filled 2022-03-25 (×3): qty 250

## 2022-03-25 MED ORDER — FAMOTIDINE IN NACL 20-0.9 MG/50ML-% IV SOLN
20.0000 mg | Freq: Once | INTRAVENOUS | Status: AC
Start: 2022-03-25 — End: 2022-03-25
  Administered 2022-03-25: 20 mg via INTRAVENOUS
  Filled 2022-03-25: qty 50

## 2022-03-25 MED ORDER — LACTATED RINGERS IV SOLN: 500.0000 mL | Freq: Once | INTRAVENOUS | Status: DC

## 2022-03-25 MED ORDER — ONDANSETRON HCL 4 MG/2ML IJ SOLN: 4.0000 mg | Freq: Four times a day (QID) | INTRAMUSCULAR | Status: DC | PRN

## 2022-03-25 MED ORDER — LIDOCAINE HCL (PF) 1 % IJ SOLN: 30.0000 mL | INTRAMUSCULAR | Status: DC | PRN

## 2022-03-25 MED ORDER — OXYTOCIN-SODIUM CHLORIDE 30-0.9 UT/500ML-% IV SOLN
1.0000 m[IU]/min | INTRAVENOUS | Status: DC
  Administered 2022-03-25: 1 m[IU]/min via INTRAVENOUS
  Filled 2022-03-25: qty 500

## 2022-03-25 MED ORDER — FENTANYL CITRATE (PF) 100 MCG/2ML IJ SOLN
100.0000 ug | Freq: Once | INTRAMUSCULAR | Status: AC | PRN
  Administered 2022-03-25: 100 ug via INTRAVENOUS
  Filled 2022-03-25: qty 2

## 2022-03-25 MED ORDER — PHENYLEPHRINE 80 MCG/ML (10ML) SYRINGE FOR IV PUSH (FOR BLOOD PRESSURE SUPPORT): 80.0000 ug | PREFILLED_SYRINGE | INTRAVENOUS | Status: DC | PRN

## 2022-03-25 MED ORDER — DIPHENHYDRAMINE HCL 50 MG/ML IJ SOLN
12.5000 mg | INTRAMUSCULAR | Status: DC | PRN
  Administered 2022-03-26: 12.5 mg via INTRAVENOUS
  Filled 2022-03-25: qty 1

## 2022-03-25 MED ORDER — FENTANYL CITRATE (PF) 100 MCG/2ML IJ SOLN
100.0000 ug | INTRAMUSCULAR | Status: DC | PRN
  Administered 2022-03-25: 100 ug via INTRAVENOUS
  Filled 2022-03-25: qty 2

## 2022-03-25 MED ORDER — OXYCODONE-ACETAMINOPHEN 5-325 MG PO TABS: 1.0000 | ORAL_TABLET | ORAL | Status: DC | PRN

## 2022-03-25 MED ORDER — OXYCODONE-ACETAMINOPHEN 5-325 MG PO TABS: 2.0000 | ORAL_TABLET | ORAL | Status: DC | PRN

## 2022-03-25 MED ORDER — TERBUTALINE SULFATE 1 MG/ML IJ SOLN: 0.2500 mg | Freq: Once | INTRAMUSCULAR | Status: DC | PRN

## 2022-03-25 MED ORDER — TRANEXAMIC ACID-NACL 1000-0.7 MG/100ML-% IV SOLN
1000.0000 mg | Freq: Once | INTRAVENOUS | Status: AC | PRN
  Administered 2022-03-27: 1000 mg via INTRAVENOUS
  Filled 2022-03-25: qty 100

## 2022-03-25 MED ORDER — SOD CITRATE-CITRIC ACID 500-334 MG/5ML PO SOLN
30.0000 mL | ORAL | Status: DC | PRN
  Administered 2022-03-25 – 2022-03-27 (×4): 30 mL via ORAL
  Filled 2022-03-25 (×4): qty 30

## 2022-03-25 NOTE — Progress Notes (Addendum)
Patient ID: Gina Pearson, female   DOB: 09-12-1995, 26 y.o.   MRN: [id number]  S/p cervical foley around 2000; feeling ctx more  BPs 120/69, 106/67 FHR 140s, +accels, no decels Ctx q 3 mins with Pit at 56m/min Cx not re-examined  IUP'@38'$ .2wks FGR TOLAC IOL process  Plan for cx exam around [address] [facility]CTrans[facility]8/01/2022 9:44 PM

## 2022-03-25 NOTE — H&P (Signed)
Gina Pearson is a 26 y.o. G36P1001 female at 43w2dby LMP c/w 19wk u/s presenting for IOL due to FGR.   Reports active fetal movement, contractions: none, vaginal bleeding: none, membranes: intact.  [facility]   Most recent u/s : 316w3dEFW 7%, AFI 11.8cm, ceph, ant placenta, nl dopplers, BPP 8/8.   This pregnancy complicated by: # FGR- EFW 7th% with nl dopplers # previous scheduled pLTCS for anal fissures> wants TOLAC> consent under media # hx LGSIL with neg bx; needs Pap postpartum # Rh neg  Prenatal History/Complications:  # G1- pLTCS for anal fissures  Past Medical History: Past Medical History:  Diagnosis Date   Anal fissure    Anal fissure    Anemia    Complication of anesthesia    has had pseudo seizures after anesthesia   Hiatal hernia    Migraines    PTSD (post-traumatic stress disorder)    Seizures (HCEva   Uterine fibroids affecting pregnancy in second trimester     Past Surgical History: Past Surgical History:  Procedure Laterality Date   CESAREAN SECTION     ERCP W/ SPHICTEROTOMY     SPHINCTEROTOMY      Obstetrical History: OB History     Gravida  2   Para  1   Term  1   Preterm      AB      Living  1      SAB      IAB      Ectopic      Multiple      Live Births  1           Social History: Social History   Socioeconomic History   Marital status: Significant Other    Spouse name: Not on file   Number of children: Not on file   Years of education: Not on file   Highest education level: Not on file  Occupational History   Not on file  Tobacco Use   Smoking status: Never   Smokeless tobacco: Never  Vaping Use   Vaping Use: Never used  Substance and Sexual Activity   Alcohol use: Not Currently    Comment: not while preg   Drug use: Never   Sexual activity: Not Currently    Birth control/protection: None  Other Topics Concern   Not on file  Social History Narrative   ** Merged  History Encounter **       Social Determinants of Health   Financial Resource Strain: Not on file  Food Insecurity: Not on file  Transportation Needs: Not on file  Physical Activity: Not on file  Stress: Not on file  Social Connections: Not on file    Family History: Family History  Problem Relation Age of Onset   Hypertension Mother    Cancer Maternal Aunt    Heart failure Paternal Uncle    Hypertension Maternal Grandmother    Diabetes Cousin    Asthma Neg Hx    Obesity Neg Hx    Stroke Neg Hx     Allergies: Allergies  Allergen Reactions   Ceftriaxone Other (See Comments)   Azithromycin     Other reaction(s): Airway constriction Other reaction(s): Airway constriction   Other     [facility].    Sumatriptan     Other reaction(s): Dysphagia Other reaction(s): Dysphagia  Medications Prior to Admission  Medication Sig Dispense Refill Last Dose   budesonide (RHINOCORT AQUA) 32 MCG/ACT nasal spray Place 2 sprays into both nostrils daily. (Patient not taking: Reported on 03/05/2022)      butalbital-acetaminophen-caffeine (FIORICET) 50-325-40 MG tablet Take 1 tablet every 6 hours for migraine headache not to exceed 5 days a month. (Patient not taking: Reported on 03/05/2022) 20 tablet 0    cetirizine (ZYRTEC) 10 MG tablet Take 10 mg by mouth daily. (Patient not taking: Reported on 03/05/2022)      FIBER ADULT GUMMIES PO Take by mouth.      promethazine (PHENERGAN) 25 MG tablet Take 1 tablet (25 mg total) by mouth every 6 (six) hours as needed for nausea or vomiting. (Patient not taking: Reported on 03/05/2022) 30 tablet 2     Review of Systems  Pertinent pos/neg as indicated in HPI  Blood pressure 114/71, pulse 87, last menstrual period 06/30/2021. General appearance: alert, cooperative, and no distress Lungs: clear to auscultation bilaterally Heart: regular rate and rhythm Abdomen: gravid,  soft, non-tender, EFW by Leopold's approximately 6lbs Extremities: 1+ edema  Fetal monitoring: FHR: 135-140s bpm, variability: moderate,  Accelerations: Present,  decelerations:  Absent Uterine activity: irreg, mild   Presentation: cephalic   Prenatal labs: ABO, Rh: O/RH(D) NEGATIVE/-- (02/03 0941) Antibody: NO ANTIBODIES DETECTED (05/19 0934) Rubella: 9.96 (02/03 0941) RPR: NON-REACTIVE (05/19 0934)  HBsAg: NON-REACTIVE (02/03 0941)  HIV: NON-REACTIVE (05/19 0934)  GBS:  neg (03/13/22)  2hr GTT: 80/77/65  Prenatal Transfer Tool  Maternal Diabetes: No Genetic Screening: Normal Maternal Ultrasounds/Referrals: IUGR Fetal Ultrasounds or other Referrals:  Referred to Materal Fetal Medicine  Maternal Substance Abuse:  No Significant Maternal Medications:  None Significant Maternal Lab Results: Group B Strep negative and Rh negative  No results found for this or any previous visit (from the past 24 hour(s)).   Assessment:  7w2dSIUP  G2P1001  FGR- EFW 7th%  Prev C/S for TOLAC  Cat 1 FHR  GBS  neg  Plan:  -Admit to L&D  -IV pain meds/epidural prn active labor -Cx unfavorable with unsuccessful cervical foley attempt; will start low dose Pitocin and hold @ 614mmin and attempt foley again later  -Anticipate successful VBAC   -Plans to breastfeed  -Contraception: declines  -Rhogam workup after delivery    KiMyrtis SerNM 03/25/2022, 11:25 AM

## 2022-03-25 NOTE — Progress Notes (Signed)
Patient ID: Gina Pearson, female   DOB: [birth year], 26 y.o.   MRN: [id number]  S/p Pitocin x 5 hrs at max 26m/min; feels more cramping  FHR 140-150s, +accels, no decels Ctx irreg Cx 2/50/vtx -2, very soft and post  IUP'@38'$ .2wks FGR TOLAC  When foley is dislodged, start uptitrating Pitocin Anticipate VBAC  KMyrtis SerCTrego County [facility]8/01/2022 6:20 PM

## 2022-03-26 ENCOUNTER — Inpatient Hospital Stay (HOSPITAL_COMMUNITY): Payer: No Typology Code available for payment source | Admitting: Anesthesiology

## 2022-03-26 LAB — RPR: RPR Ser Ql: NONREACTIVE

## 2022-03-26 MED ORDER — FAMOTIDINE IN NACL 20-0.9 MG/50ML-% IV SOLN
20.0000 mg | Freq: Once | INTRAVENOUS | Status: AC
Start: 2022-03-26 — End: 2022-03-26
  Administered 2022-03-26: 20 mg via INTRAVENOUS
  Filled 2022-03-26: qty 50

## 2022-03-26 MED ORDER — LIDOCAINE HCL (PF) 1 % IJ SOLN
INTRAMUSCULAR | Status: DC | PRN
  Administered 2022-03-26: 10 mL via EPIDURAL

## 2022-03-26 MED ORDER — CALCIUM CARBONATE ANTACID 500 MG PO CHEW
2.0000 | CHEWABLE_TABLET | Freq: Once | ORAL | Status: AC
  Administered 2022-03-26: 400 mg via ORAL
  Filled 2022-03-26: qty 2

## 2022-03-26 NOTE — Progress Notes (Signed)
Patient ID: Evella Kasal, female   DOB: Mar 20, 1996, 26 y.o.   MRN: [id number]  Doing well w Pit; using nitrous prn; up on ball, etc   FHR 135-145, +accels, no decels Ctx q 3-4 mins with Pit @ 75m/min Cx post/5/60/vtx -2; AROM for clear/pink tinged fluid  VSS, afebrile IUP'@38'$ .3wks FGR TOLAC IOL process  Hopeful that with Pit/AROM, labor will progress  KMyrtis SerCSuncoast [facility] Center8/02/2022 5:24 AM

## 2022-03-26 NOTE — Anesthesia Procedure Notes (Signed)
Epidural Patient location during procedure: OB Start time: 03/26/2022 6:37 AM End time: 03/26/2022 6:41 AM  Staffing Anesthesiologist: Lyn Hollingshead, MD Performed: anesthesiologist   Preanesthetic Checklist Completed: patient identified, IV checked, site marked, risks and benefits discussed, surgical consent, monitors and equipment checked, pre-op evaluation and timeout performed  Epidural Patient position: sitting Prep: DuraPrep and site prepped and draped Patient monitoring: continuous pulse ox and blood pressure Approach: midline Location: L3-L4 Injection technique: LOR air  Needle:  Needle type: Tuohy  Needle gauge: 17 G Needle length: 9 cm and 9 Needle insertion depth: 6 cm Catheter type: closed end flexible Catheter size: 19 Gauge Catheter at skin depth: 11 cm Test dose: negative and Other  Assessment Events: blood not aspirated, injection not painful, no injection resistance, no paresthesia and negative IV test  Additional Notes Reason for block:procedure for pain

## 2022-03-26 NOTE — Progress Notes (Signed)
Labor Progress Note Gina Pearson is a 26 y.o. G2P1001 at 46w3dpresented for FGR, TOLAC  S: Doing well. Feeling contractions, tolerating pain well.  O:  BP 105/68   Pulse 86   Temp 99 F (37.2 C) (Axillary)   Resp 15   Ht '5\' 4"'$  (1.626 m)   Wt 68.9 kg   LMP 06/30/2021   SpO2 100%   BMI 26.09 kg/m  EFM: 145bpm/mod varibility/15x15 accels/ no decels  CVE: Dilation: 3 Effacement (%): 80 Station: -2 Presentation: Vertex Exam by:: Dr. CCaron Presume  A&P: 26y.o. G2P1001 362w3dGR, TOLAC #Labor: Progressing well. Will stop pitocin for now, allow pt to rest.  #Pain: epidural in place #FWB: CAT 1 #GBS negative   JeSeffnerDO 9:23 PM

## 2022-03-26 NOTE — Progress Notes (Signed)
LABOR PROGRESS NOTE  Gina Pearson is a 26 y.o. G2P1001 at [redacted]w[redacted]d [facility]   Subjective: Epidural in place and patient recently redosed so is very comfortable at this time.    Objective: BP (!) 129/95   Pulse 79   Temp 99 F (37.2 C) (Axillary)   Resp 17   Ht '5\' 4"'$  (1.626 m)   Wt 68.9 kg   LMP 06/30/2021   SpO2 100%   BMI 26.09 kg/m  or  Vitals:   03/26/22 1730 03/26/22 1800 03/26/22 1830 03/26/22 1900  BP: 113/73 111/82 106/78 (!) 129/95  Pulse: 78 80 79   Resp:  16 17   Temp:    99 F (37.2 C)  TempSrc:    Axillary  SpO2:      Weight:      Height:        Dilation: 3 Effacement (%): 80 Station: -2 Presentation: Vertex Exam by:: Dr. 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 G2P1001 at 362w3dere for IOL for FGR. TOLAC  Labor: Pitocin  currently at 3477Cervix has thinned but not dilated. Will continue to titrate pitocin.  Fetal Wellbeing:  Cat 1, reassuring  Pain Control:  Epidural in place  Anticipated MOD:  Vaginal   ViGifford ShaveMD  03/26/2022, 7:46 PM

## 2022-03-26 NOTE — Progress Notes (Signed)
LABOR PROGRESS NOTE  Gina Pearson is a 26 y.o. G2P1001 at [redacted]w[redacted]d [facility]   Subjective: Feeling comfortable with epidural.  Objective: BP 121/67   Pulse 91   Temp 98.1 F (36.7 C) (Oral)   Resp 18   Ht '5\' 4"'$  (1.626 m)   Wt 68.9 kg   LMP 06/30/2021   SpO2 100%   BMI 26.09 kg/m  or  Vitals:   03/26/22 0730 03/26/22 0800 03/26/22 0830 03/26/22 0904  BP: (!) 99/58 (!) 90/50 (!) 98/54 121/67  Pulse: (!) 107 75 94 91  Resp:  '16 16 18  '$ Temp: 98 F (36.7 C)   98.1 F (36.7 C)  TempSrc: Oral   Oral  SpO2:      Weight:      Height:        Dilation: 3 Effacement (%): 60 Station: -2 Presentation: Vertex Exam by:: Dr. 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 G2P1001 at 372w3dere for IOL for FGR. TOLAC  Labor: Pitocin currently running at 22. IUPC  placed at this check. If not adequate consider pitocin break. Fetal Wellbeing:  Cat 1, reassuring  Pain Control:  Epidural in place  Anticipated MOD:  Vaginal   ViGifford ShaveMD  03/26/2022, 9:19 AM

## 2022-03-26 NOTE — Progress Notes (Signed)
Patient ID: Gina Pearson, female   DOB: 1996-04-01, 26 y.o.   MRN: [id number]  Resting in between ctx; using nitrous  BP 130/94 w pain; others 111/78, 115/69, 120/69 FHR 130s, +accels, no decels Ctx were q 3 mins, now more spaced out with Pit @ 26m/min Cx 5/60/vtx -2 @ 29969per RN exam  IUP'@38'$ .1wks FGR TOLAC IOL process  Continue keeping ctx reg w PSavoyfor AROM later this morning Anticipate successful VBAC  KMyrtis SerCNM 03/26/2022 12:45 AM

## 2022-03-26 NOTE — Progress Notes (Signed)
LABOR PROGRESS NOTE  Gina Pearson is a 26 y.o. G2P1001 at [redacted]w[redacted]d [facility]   Subjective: Feeling more uncomfortable at this time.   Objective: BP (!) 100/58   Pulse 74   Temp 97.8 F (36.6 C) (Oral)   Resp 16   Ht '5\' 4"'$  (1.626 m)   Wt 68.9 kg   LMP 06/30/2021   SpO2 100%   BMI 26.09 kg/m  or  Vitals:   03/26/22 1430 03/26/22 1500 03/26/22 1600 03/26/22 1630  BP: 101/61 (!) 95/53 (!) 107/54 (!) 100/58  Pulse: 79 79 75 74  Resp: '18 16 18 16  '$ Temp:      TempSrc:      SpO2:      Weight:      Height:        Dilation: 3 Effacement (%): 60 Station: -2 Presentation: Vertex Exam by:: Dr. CCaron PresumeFHT: baseline rate 145, moderate varibility, + acel, no decel Toco: every 3 min   Labs: Lab Results  Component Value Date   WBC 10.2 03/25/2022   HGB 8.7 (L) 03/25/2022   HCT 28.0 (L) 03/25/2022   MCV 76.7 (L) 03/25/2022   PLT 337 03/25/2022    Patient Active Problem List   Diagnosis Date Noted   Fetal growth restriction antepartum 01/18/2022   Allergic rhinitis, unspecified 10/05/2021   Recurrent urinary tract infection 10/05/2021   Superficial (introital) dyspareunia 10/05/2021   Hemorrhoid 10/05/2021   Low grade squamous intraepithelial lesion on cytologic smear of cervix (LGSIL) 10/05/2021   Migraine, unspecified, not intractable, without status migrainosus 10/05/2021   History of cesarean section 10/05/2021   Supervision of other normal pregnancy, antepartum 09/08/2021   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 G2P1001 at 352w3dere for IOL for FGR. TOLAC  Labor: Patient had pitocin break. Pit restarted at 10 and titrated up.  Currently on 26. Has not made any cervical change. Will continue to titrate as able.  Fetal Wellbeing:  Cat 1, reassuring  Pain Control:  Epidural in place  Anticipated MOD:  Vaginal   ViGifford ShaveMD  03/26/2022, 4:58 PM

## 2022-03-26 NOTE — Anesthesia Preprocedure Evaluation (Addendum)
Anesthesia Evaluation  Patient identified by MRN, date of birth, ID band Patient awake    Reviewed: Allergy & Precautions, H&P , Patient's Chart, lab work & pertinent test results  Airway Mallampati: I       Dental no notable dental hx.    Pulmonary neg pulmonary ROS,    Pulmonary exam normal        Cardiovascular negative cardio ROS Normal cardiovascular exam     Neuro/Psych    GI/Hepatic Neg liver ROS, hiatal hernia,   Endo/Other  negative endocrine ROS  Renal/GU negative Renal ROS     Musculoskeletal   Abdominal Normal abdominal exam  (+)   Peds  Hematology  (+) Blood dyscrasia, anemia ,   Anesthesia Other Findings   Reproductive/Obstetrics (+) Pregnancy                            Anesthesia Physical Anesthesia Plan  ASA: 2  Anesthesia Plan: Epidural   Post-op Pain Management:    Induction:   PONV Risk Score and Plan:   Airway Management Planned:   Additional Equipment:   Intra-op Plan:   Post-operative Plan:   Informed Consent: I have reviewed the patients History and Physical, chart, labs and discussed the procedure including the risks, benefits and alternatives for the proposed anesthesia with the patient or authorized representative who has indicated his/her understanding and acceptance.       Plan Discussed with:   Anesthesia Plan Comments:         Anesthesia Quick Evaluation

## 2022-03-27 ENCOUNTER — Encounter (HOSPITAL_COMMUNITY): Payer: Self-pay | Admitting: Family Medicine

## 2022-03-27 ENCOUNTER — Other Ambulatory Visit: Payer: Self-pay

## 2022-03-27 ENCOUNTER — Encounter: Payer: No Typology Code available for payment source | Admitting: Obstetrics and Gynecology

## 2022-03-27 ENCOUNTER — Encounter (HOSPITAL_COMMUNITY)
Admission: AD | Disposition: A | Discharge status: Home / Self Care | Payer: Self-pay | Source: Home / Self Care | Attending: Family Medicine

## 2022-03-27 DIAGNOSIS — O9902 Anemia complicating childbirth: Secondary | ICD-10-CM

## 2022-03-27 DIAGNOSIS — D649 Anemia, unspecified: Secondary | ICD-10-CM

## 2022-03-27 DIAGNOSIS — O365930 Maternal care for other known or suspected poor fetal growth, third trimester, not applicable or unspecified: Secondary | ICD-10-CM

## 2022-03-27 DIAGNOSIS — O34211 Maternal care for low transverse scar from previous cesarean delivery: Secondary | ICD-10-CM

## 2022-03-27 DIAGNOSIS — Z3A38 38 weeks gestation of pregnancy: Secondary | ICD-10-CM

## 2022-03-27 LAB — CBC
HCT: 27.3 % — ABNORMAL LOW (ref 36.0–46.0)
Hemoglobin: 8.3 g/dL — ABNORMAL LOW (ref 12.0–15.0)
MCH: 23.6 pg — ABNORMAL LOW (ref 26.0–34.0)
MCHC: 30.4 g/dL (ref 30.0–36.0)
MCV: 77.8 fL — ABNORMAL LOW (ref 80.0–100.0)
Platelets: 302 10*3/uL (ref 150–400)
RBC: 3.51 MIL/uL — ABNORMAL LOW (ref 3.87–5.11)
RDW: 15.3 % (ref 11.5–15.5)
WBC: 14.1 10*3/uL — ABNORMAL HIGH (ref 4.0–10.5)
nRBC: 0 % (ref 0.0–0.2)

## 2022-03-27 SURGERY — Surgical Case
Anesthesia: Epidural | Site: Abdomen | Wound class: Clean Contaminated

## 2022-03-27 MED ORDER — COCONUT OIL OIL: 1.0000 | TOPICAL_OIL | Status: DC | PRN

## 2022-03-27 MED ORDER — PRENATAL MULTIVITAMIN CH
1.0000 | ORAL_TABLET | Freq: Every day | ORAL | Status: DC
Start: 2022-03-28 — End: 2022-03-29
  Administered 2022-03-28 – 2022-03-29 (×2): 1 via ORAL
  Filled 2022-03-27 (×2): qty 1

## 2022-03-27 MED ORDER — FONDAPARINUX SODIUM 2.5 MG/0.5ML ~~LOC~~ SOLN
2.5000 mg | SUBCUTANEOUS | Status: DC
  Administered 2022-03-28 – 2022-03-29 (×2): 2.5 mg via SUBCUTANEOUS
  Filled 2022-03-27 (×2): qty 0.5

## 2022-03-27 MED ORDER — LIDOCAINE HCL (PF) 1 % IJ SOLN
INTRAMUSCULAR | Status: AC
  Filled 2022-03-27: qty 5

## 2022-03-27 MED ORDER — PHENYLEPHRINE HCL-NACL 20-0.9 MG/250ML-% IV SOLN
INTRAVENOUS | Status: DC | PRN
  Administered 2022-03-27: 60 ug/min via INTRAVENOUS

## 2022-03-27 MED ORDER — FENTANYL CITRATE (PF) 100 MCG/2ML IJ SOLN
INTRAMUSCULAR | Status: AC
  Filled 2022-03-27: qty 2

## 2022-03-27 MED ORDER — OXYCODONE HCL 5 MG PO TABS
5.0000 mg | ORAL_TABLET | ORAL | Status: DC | PRN
  Administered 2022-03-28 (×2): 10 mg via ORAL
  Administered 2022-03-28: 5 mg via ORAL
  Administered 2022-03-29 (×2): 10 mg via ORAL
  Filled 2022-03-27 (×2): qty 2
  Filled 2022-03-27: qty 1
  Filled 2022-03-27 (×2): qty 2

## 2022-03-27 MED ORDER — KETOROLAC TROMETHAMINE 30 MG/ML IJ SOLN
INTRAMUSCULAR | Status: AC
  Filled 2022-03-27: qty 1

## 2022-03-27 MED ORDER — ONDANSETRON HCL 4 MG/2ML IJ SOLN
INTRAMUSCULAR | Status: DC | PRN
  Administered 2022-03-27: 4 mg via INTRAVENOUS

## 2022-03-27 MED ORDER — OXYTOCIN-SODIUM CHLORIDE 30-0.9 UT/500ML-% IV SOLN: 2.5000 [IU]/h | INTRAVENOUS | Status: AC

## 2022-03-27 MED ORDER — SODIUM CHLORIDE 0.9 % IR SOLN
Status: DC | PRN
  Administered 2022-03-27: 1000 mL

## 2022-03-27 MED ORDER — MORPHINE SULFATE (PF) 0.5 MG/ML IJ SOLN
INTRAMUSCULAR | Status: DC | PRN
Start: 2022-03-27 — End: 2022-03-27
  Administered 2022-03-27: .15 mg via INTRATHECAL

## 2022-03-27 MED ORDER — BUPIVACAINE IN DEXTROSE 0.75-8.25 % IT SOLN
INTRATHECAL | Status: DC | PRN
  Administered 2022-03-27: 1.3 mg via INTRATHECAL

## 2022-03-27 MED ORDER — OXYTOCIN-SODIUM CHLORIDE 30-0.9 UT/500ML-% IV SOLN
INTRAVENOUS | Status: AC
  Filled 2022-03-27: qty 500

## 2022-03-27 MED ORDER — SIMETHICONE 80 MG PO CHEW
80.0000 mg | CHEWABLE_TABLET | Freq: Three times a day (TID) | ORAL | Status: DC
  Administered 2022-03-28 – 2022-03-29 (×3): 80 mg via ORAL
  Filled 2022-03-27 (×3): qty 1

## 2022-03-27 MED ORDER — ACETAMINOPHEN 10 MG/ML IV SOLN
1000.0000 mg | Freq: Once | INTRAVENOUS | Status: DC | PRN
  Administered 2022-03-27: 1000 mg via INTRAVENOUS

## 2022-03-27 MED ORDER — FENTANYL CITRATE (PF) 100 MCG/2ML IJ SOLN
INTRAMUSCULAR | Status: DC | PRN
  Administered 2022-03-27: 15 ug via INTRATHECAL

## 2022-03-27 MED ORDER — PHENYLEPHRINE HCL-NACL 20-0.9 MG/250ML-% IV SOLN
INTRAVENOUS | Status: AC
  Filled 2022-03-27: qty 250

## 2022-03-27 MED ORDER — DIPHENHYDRAMINE HCL 50 MG/ML IJ SOLN
12.5000 mg | INTRAMUSCULAR | Status: DC | PRN
  Administered 2022-03-27: 12.5 mg via INTRAVENOUS
  Filled 2022-03-27: qty 1

## 2022-03-27 MED ORDER — OXYTOCIN-SODIUM CHLORIDE 30-0.9 UT/500ML-% IV SOLN
INTRAVENOUS | Status: DC | PRN
  Administered 2022-03-27: 300 mL via INTRAVENOUS

## 2022-03-27 MED ORDER — NALOXONE HCL 4 MG/10ML IJ SOLN: 1.0000 ug/kg/h | INTRAVENOUS | Status: DC | PRN

## 2022-03-27 MED ORDER — LIDOCAINE-EPINEPHRINE (PF) 2 %-1:200000 IJ SOLN
INTRAMUSCULAR | Status: AC
  Filled 2022-03-27: qty 20

## 2022-03-27 MED ORDER — DEXMEDETOMIDINE HCL IN NACL 80 MCG/20ML IV SOLN
INTRAVENOUS | Status: AC
  Filled 2022-03-27: qty 20

## 2022-03-27 MED ORDER — ONDANSETRON HCL 4 MG/2ML IJ SOLN
INTRAMUSCULAR | Status: AC
  Filled 2022-03-27: qty 2

## 2022-03-27 MED ORDER — CLINDAMYCIN PHOSPHATE 600 MG/50ML IV SOLN
600.0000 mg | Freq: Once | INTRAVENOUS | Status: AC
  Administered 2022-03-27: 600 mg via INTRAVENOUS
  Filled 2022-03-27: qty 50

## 2022-03-27 MED ORDER — TETANUS-DIPHTH-ACELL PERTUSSIS 5-2.5-18.5 LF-MCG/0.5 IM SUSY: 0.5000 mL | PREFILLED_SYRINGE | Freq: Once | INTRAMUSCULAR | Status: DC

## 2022-03-27 MED ORDER — SIMETHICONE 80 MG PO CHEW
80.0000 mg | CHEWABLE_TABLET | ORAL | Status: DC | PRN
  Administered 2022-03-27: 80 mg via ORAL
  Filled 2022-03-27: qty 1

## 2022-03-27 MED ORDER — ACETAMINOPHEN 500 MG PO TABS
1000.0000 mg | ORAL_TABLET | Freq: Four times a day (QID) | ORAL | Status: DC | PRN
  Administered 2022-03-27 – 2022-03-28 (×3): 1000 mg via ORAL
  Filled 2022-03-27 (×3): qty 2

## 2022-03-27 MED ORDER — ACETAMINOPHEN 160 MG/5ML PO SOLN: 325.0000 mg | ORAL | Status: DC | PRN

## 2022-03-27 MED ORDER — ONDANSETRON HCL 4 MG/2ML IJ SOLN: 4.0000 mg | Freq: Three times a day (TID) | INTRAMUSCULAR | Status: DC | PRN

## 2022-03-27 MED ORDER — NALOXONE HCL 0.4 MG/ML IJ SOLN: 0.4000 mg | INTRAMUSCULAR | Status: DC | PRN

## 2022-03-27 MED ORDER — ACETAMINOPHEN 325 MG PO TABS: 325.0000 mg | ORAL_TABLET | ORAL | Status: DC | PRN

## 2022-03-27 MED ORDER — DIPHENHYDRAMINE HCL 25 MG PO CAPS
25.0000 mg | ORAL_CAPSULE | Freq: Four times a day (QID) | ORAL | Status: DC | PRN
  Filled 2022-03-27: qty 1

## 2022-03-27 MED ORDER — WITCH HAZEL-GLYCERIN EX PADS: 1.0000 | MEDICATED_PAD | CUTANEOUS | Status: DC | PRN

## 2022-03-27 MED ORDER — IBUPROFEN 600 MG PO TABS
600.0000 mg | ORAL_TABLET | Freq: Four times a day (QID) | ORAL | Status: DC | PRN
Start: 2022-03-28 — End: 2022-03-29
  Administered 2022-03-28 – 2022-03-29 (×3): 600 mg via ORAL
  Filled 2022-03-27 (×4): qty 1

## 2022-03-27 MED ORDER — GENTAMICIN SULFATE 40 MG/ML IJ SOLN
5.0000 mg/kg | Freq: Once | INTRAMUSCULAR | Status: AC
  Administered 2022-03-27: 344.4 mg via INTRAVENOUS
  Filled 2022-03-27: qty 8.5

## 2022-03-27 MED ORDER — FENTANYL CITRATE (PF) 100 MCG/2ML IJ SOLN
25.0000 ug | INTRAMUSCULAR | Status: DC | PRN
  Administered 2022-03-27: 25 ug via INTRAVENOUS

## 2022-03-27 MED ORDER — CLINDAMYCIN PHOSPHATE 600 MG/50ML IV SOLN
INTRAVENOUS | Status: AC
  Filled 2022-03-27: qty 50

## 2022-03-27 MED ORDER — PROMETHAZINE HCL 25 MG/ML IJ SOLN: 6.2500 mg | INTRAMUSCULAR | Status: DC | PRN

## 2022-03-27 MED ORDER — SCOPOLAMINE 1 MG/3DAYS TD PT72: 1.0000 | MEDICATED_PATCH | Freq: Once | TRANSDERMAL | Status: DC

## 2022-03-27 MED ORDER — MORPHINE SULFATE (PF) 0.5 MG/ML IJ SOLN
INTRAMUSCULAR | Status: AC
  Filled 2022-03-27: qty 10

## 2022-03-27 MED ORDER — DEXAMETHASONE SODIUM PHOSPHATE 10 MG/ML IJ SOLN
INTRAMUSCULAR | Status: DC | PRN
  Administered 2022-03-27: 10 mg via INTRAVENOUS

## 2022-03-27 MED ORDER — MENTHOL 3 MG MT LOZG: 1.0000 | LOZENGE | OROMUCOSAL | Status: DC | PRN

## 2022-03-27 MED ORDER — SODIUM CHLORIDE 0.9% FLUSH: 3.0000 mL | INTRAVENOUS | Status: DC | PRN

## 2022-03-27 MED ORDER — KETOROLAC TROMETHAMINE 30 MG/ML IJ SOLN
30.0000 mg | Freq: Four times a day (QID) | INTRAMUSCULAR | Status: AC | PRN
  Administered 2022-03-27 – 2022-03-28 (×2): 30 mg via INTRAVENOUS
  Filled 2022-03-27 (×2): qty 1

## 2022-03-27 MED ORDER — DEXMEDETOMIDINE HCL IN NACL 200 MCG/50ML IV SOLN
INTRAVENOUS | Status: DC | PRN
  Administered 2022-03-27: 8 ug via INTRAVENOUS
  Administered 2022-03-27: 4 ug via INTRAVENOUS

## 2022-03-27 MED ORDER — KETOROLAC TROMETHAMINE 30 MG/ML IJ SOLN
30.0000 mg | Freq: Four times a day (QID) | INTRAMUSCULAR | Status: AC | PRN
  Administered 2022-03-27: 30 mg via INTRAMUSCULAR

## 2022-03-27 MED ORDER — DEXAMETHASONE SODIUM PHOSPHATE 10 MG/ML IJ SOLN
INTRAMUSCULAR | Status: AC
  Filled 2022-03-27: qty 1

## 2022-03-27 MED ORDER — DIPHENHYDRAMINE HCL 25 MG PO CAPS
25.0000 mg | ORAL_CAPSULE | ORAL | Status: DC | PRN
  Administered 2022-03-28: 25 mg via ORAL

## 2022-03-27 MED ORDER — DIBUCAINE (PERIANAL) 1 % EX OINT: 1.0000 | TOPICAL_OINTMENT | CUTANEOUS | Status: DC | PRN

## 2022-03-27 MED ORDER — SENNOSIDES-DOCUSATE SODIUM 8.6-50 MG PO TABS
2.0000 | ORAL_TABLET | Freq: Every day | ORAL | Status: DC
  Administered 2022-03-28 – 2022-03-29 (×2): 2 via ORAL
  Filled 2022-03-27 (×2): qty 2

## 2022-03-27 MED ORDER — MEPERIDINE HCL 25 MG/ML IJ SOLN: 6.2500 mg | INTRAMUSCULAR | Status: DC | PRN

## 2022-03-27 SURGICAL SUPPLY — 35 items
BENZOIN TINCTURE PRP APPL 2/3 (GAUZE/BANDAGES/DRESSINGS) ×1 IMPLANT
CHLORAPREP W/TINT 26ML (MISCELLANEOUS) ×4 IMPLANT
CLAMP CORD UMBIL (MISCELLANEOUS) ×2 IMPLANT
CLOTH BEACON ORANGE TIMEOUT ST (SAFETY) ×2 IMPLANT
DERMABOND ADVANCED (GAUZE/BANDAGES/DRESSINGS) ×2
DERMABOND ADVANCED .7 DNX12 (GAUZE/BANDAGES/DRESSINGS) IMPLANT
DRSG OPSITE POSTOP 4X10 (GAUZE/BANDAGES/DRESSINGS) ×2 IMPLANT
ELECT REM PT RETURN 9FT ADLT (ELECTROSURGICAL) ×2
ELECTRODE REM PT RTRN 9FT ADLT (ELECTROSURGICAL) ×1 IMPLANT
EXTRACTOR VACUUM M CUP 4 TUBE (SUCTIONS) IMPLANT
GLOVE BIOGEL PI IND STRL 7.0 (GLOVE) ×2 IMPLANT
GLOVE BIOGEL PI IND STRL 7.5 (GLOVE) ×2 IMPLANT
GLOVE BIOGEL PI INDICATOR 7.0 (GLOVE) ×2
GLOVE BIOGEL PI INDICATOR 7.5 (GLOVE) ×2
GLOVE ECLIPSE 7.5 STRL STRAW (GLOVE) ×2 IMPLANT
GOWN STRL REUS W/TWL LRG LVL3 (GOWN DISPOSABLE) ×6 IMPLANT
KIT ABG SYR 3ML LUER SLIP (SYRINGE) IMPLANT
NDL HYPO 25X5/8 SAFETYGLIDE (NEEDLE) IMPLANT
NEEDLE HYPO 25X5/8 SAFETYGLIDE (NEEDLE) IMPLANT
NS IRRIG 1000ML POUR BTL (IV SOLUTION) ×2 IMPLANT
PACK C SECTION WH (CUSTOM PROCEDURE TRAY) ×2 IMPLANT
PAD OB MATERNITY 4.3X12.25 (PERSONAL CARE ITEMS) ×2 IMPLANT
RTRCTR C-SECT PINK 25CM LRG (MISCELLANEOUS) ×2 IMPLANT
STRIP CLOSURE SKIN 1/2X4 (GAUZE/BANDAGES/DRESSINGS) ×1 IMPLANT
SUT MNCRL 0 VIOLET CTX 36 (SUTURE) ×2 IMPLANT
SUT MONOCRYL 0 CTX 36 (SUTURE) ×2
SUT VIC AB 0 CTX 36 (SUTURE) ×1
SUT VIC AB 0 CTX36XBRD ANBCTRL (SUTURE) ×1 IMPLANT
SUT VIC AB 2-0 CT1 27 (SUTURE) ×1
SUT VIC AB 2-0 CT1 TAPERPNT 27 (SUTURE) ×1 IMPLANT
SUT VIC AB 4-0 KS 27 (SUTURE) ×2 IMPLANT
TOWEL OR 17X24 6PK STRL BLUE (TOWEL DISPOSABLE) ×2 IMPLANT
TRAY FOLEY W/BAG SLVR 14FR LF (SET/KITS/TRAYS/PACK) ×1 IMPLANT
VACUUM CUP M-STYLE MYSTIC II (SUCTIONS) ×1 IMPLANT
WATER STERILE IRR 1000ML POUR (IV SOLUTION) ×2 IMPLANT

## 2022-03-27 NOTE — Progress Notes (Signed)
Patient ID: Gina Pearson, female   DOB: 1995/12/08, 26 y.o.   MRN: [id number]  Patient seen and examined. No cervical change. Discussed options with patient. Currently on 24 milliunits of pitocin without adequate labor. She has had a pitocin break already. She would like to proceed with cesarean delivery.  The risks of cesarean section discussed with the patient included but were not limited to: bleeding which may require transfusion or reoperation; infection which may require antibiotics; injury to bowel, bladder, ureters or other surrounding organs; injury to the fetus; need for additional procedures including hysterectomy in the event of a life-threatening hemorrhage; placental abnormalities wth subsequent pregnancies, incisional problems, thromboembolic phenomenon and other postoperative/anesthesia complications. The patient concurred with the proposed plan, giving informed written consent for the procedure.   Patient has been NPO, she will remain NPO for procedure. Anesthesia and OR aware.  Preoperative prophylactic antibiotics ordered on call to the OR.  To OR when ready.  Truett Mainland, DO 03/27/2022 3:02 PM

## 2022-03-27 NOTE — Progress Notes (Signed)
Labor Progress Note Gina Pearson is a 26 y.o. G2P1001 at 33w4dpresented for IOL for FGR, TOLAC.   S: Resting comfortable. No concerns at this time.   O:  BP 123/68   Pulse 89   Temp 98.4 F (36.9 C) (Oral)   Resp 16   Ht '5\' 4"'$  (1.626 m)   Wt 68.9 kg   LMP 06/30/2021   SpO2 100%   BMI 26.09 kg/m  EFM: 120 bpm/moderate variability/+accels, no decels  CVE: Dilation: 4 Effacement (%): 80, 90 Station: -3, -2 Presentation: Vertex Exam by:: Dr. AJanus Molder  A&P: 26y.o. G2P1001 358w4dOL, TOLAC 2/2 FGR #Labor: IUPC was dislodged. Replaced. Continue to assess for progression to complete.  Monitor for signs of uterine rupture.  #Pain: Epidural  #FWB: Cat I #GBS negative  Jaycob Mcclenton Autry-Lott, DO 8:46 AM

## 2022-03-27 NOTE — Lactation Note (Addendum)
This note was copied from a baby's chart. Lactation Consultation Note  Patient Name: Gina Pearson OFBPZ'W Date: 03/27/2022 Reason for consult: Initial assessment;Early term 37-38.6wks;Infant < 6lbs Age:25 hours P2, ETI female infant. Initially infant was sleepy and past 3 hours, Birth Parent easily self expressed 4 mls of colostrum that was spoon fed to infant.  Birth Parent latched infant on her left breast using the cross cradle hold, infant BF for 17 minutes.  Birth Parent was set up with DEBP and plans to pump after infant has blood work done, [employer] explained how to use, Birth Parent understands to pump every 3 hours for 15 minutes on initial setting. Birth Parent knows colostrum is safe at room temperature up to 4 hours. Birth Parent made aware of O/P services, breastfeeding support groups, community resources, and our phone # for post-discharge questions.   Birth Parent feeding plan: 1- Birth Parent will BF infant according to cues, 8 + times within 24 hours, STS due infant size limit total feeding 30 minutes or less. 2- Afterwards Birth Parent will supplement infant with any EBM that was pumped offering by spoon or curve tip syringe. 3- Birth Parent will use DEBP every 3 hours for 15 minutes on initial setting. 4- Birth Parent knows to call [place] if their are any BF questions, concerns or need latch assistance.  Maternal Data Has patient been taught Hand Expression?: Yes Does the patient have breastfeeding experience prior to this delivery?: Yes How long did the patient breastfeed?: Birth Parent BF 1st child for 14 months , he is currently 57 years old.  Feeding Mother's Current Feeding Choice: Breast Milk and Formula  LATCH Score Latch: Grasps breast easily, tongue down, lips flanged, rhythmical sucking.  Audible Swallowing: Spontaneous and intermittent  Type of Nipple: Everted at rest and after stimulation  Comfort (Breast/Nipple): Soft / non-tender  Hold (Positioning):  Assistance needed to correctly position infant at breast and maintain latch.  LATCH Score: 9   Lactation Tools Discussed/Used Tools: Pump Breast pump type: Double-Electric Breast Pump Pump Education: Setup, frequency, and cleaning;Milk Storage Reason for Pumping: Infant less than 6 lbs and ETI . Pumping frequency: Every 3  hours for 15 minutes on inital setting  Interventions Interventions: Breast feeding basics reviewed;Assisted with latch;Skin to skin;Breast compression;Adjust position;Support pillows;Position options;Expressed milk;Hand express;Education;DEBP;LC Services brochure  Discharge Pump: Personal (DEBP Bernita Buffy)  Consult Status Consult Status: Follow-up Date: 03/28/22 Follow-up type: In-patient    Gina Pearson 03/27/2022, 9:27 PM

## 2022-03-27 NOTE — Progress Notes (Signed)
Labor Progress Note Gina Pearson is a 26 y.o. G2P1001 at 73w4dpresented for IOL for FGR, TOLAC.   S: Uncomfortable with contractions, having relief in between them.   O:  BP 133/78   Pulse 93   Temp 97.8 F (36.6 C) (Axillary)   Resp 18   Ht '5\' 4"'$  (1.626 m)   Wt 68.9 kg   LMP 06/30/2021   SpO2 100%   BMI 26.09 kg/m  EFM: 125  bpm/moderate/+accels, 1 late decel   CVE: Dilation: 5 Effacement (%): 90 Station: -1 Presentation: Vertex Exam by:: AWilford Sports RN   A&P: 26y.o. G2P1001 350w4dOL, TOLAC 2/2 FGR #Labor: Progressing well. Initially having some issue with tracing baby. Now consistently on the monitor and 1 late decel. Pit at 24 mU/min.  #Pain: Lower anterior abdomen with contractions. Nursing plans to call anesthesia to make sure epidural is adequate.  #FWB: Cat II, position change to left side. Continue to monitor.  #GBS negative  Berneda Piccininni Autry-Lott, DO 2:12 PM

## 2022-03-27 NOTE — Progress Notes (Signed)
Labor progress note  Gina Pearson is a 26 y.o. G2P1001 at 41w3dpresented for FGR, TOLAC   S: Doing well.    O:  BP 105/68   Pulse 86   Temp 99 F (37.2 C) (Axillary)   Resp 15   Ht '5\' 4"'$  (1.626 m)   Wt 68.9 kg   LMP 06/30/2021   SpO2 100%   BMI 26.09 kg/m  EFM: 145bpm/mod varibility/15x15 accels/ no decels   CVE: Dilation: 3 Effacement (%): 80 Station: -2 Presentation: Vertex Exam by:: JRoxy CedarDO      A&P: 26y.o. G2P1001 348w3dGR, TOLAC #Labor: Still no progress on SVE. Will restart pitocin and place pt on rotational positions, exaggerated lateral sims right  and left q1hr #Pain: epidural in place #FWB: CAT 1 #GBS negative  JeShelda PalDOStinesvilleellow, Faculty practice CoWaldportor WoNorth Chicago8/08/23  12:19 AM

## 2022-03-27 NOTE — Op Note (Signed)
Gina Pearson PROCEDURE DATE: 03/27/2022  PREOPERATIVE DIAGNOSES: Intrauterine pregnancy at 39w4dweeks gestation; failure to progress: arrest of dilation and fetal growth restriction  POSTOPERATIVE DIAGNOSES: The same, in addition to Vacuum assisted cesarean delivery  PROCEDURE: Low Transverse Cesarean Section  SURGEON:  Dr. JLoma Boston ASSISTANT:  Dr. AJanus Molder An experienced assistant was required given the standard of surgical care given the complexity of the case.  This assistant was needed for exposure, dissection, suctioning, retraction, instrument exchange, assisting with delivery with administration of fundal pressure, and for overall help during the procedure.  ANESTHESIOLOGY TEAM: Anesthesiologist: FJosephine Igo MD; HLyn Hollingshead MD; HEffie Berkshire MD; SDuane Boston MD CRNA: BAsher Muir CRNA  INDICATIONS: Gina Speeceis a 26y.o. [id number] 330w4dere for cesarean section secondary to the indications listed under preoperative diagnoses; please see preoperative note for further details.  The risks of surgery were discussed with the patient including but were not limited to: bleeding which may require transfusion or reoperation; infection which may require antibiotics; injury to bowel, bladder, ureters or other surrounding organs; injury to the fetus; need for additional procedures including hysterectomy in the event of a life-threatening hemorrhage; formation of adhesions; placental abnormalities wth subsequent pregnancies; incisional problems; thromboembolic phenomenon and other postoperative/anesthesia complications.  The patient concurred with the proposed plan, giving informed written consent for the procedure.    FINDINGS:  Viable female infant in cephalic presentation.  Apgars 7 and 8.  Clear amniotic fluid.  Intact placenta, three vessel cord.  Normal uterus, fallopian tubes and ovaries bilaterally.  ANESTHESIA: Spinal INTRAVENOUS FLUIDS: 1200 ml    ESTIMATED BLOOD LOSS: 389 ml URINE OUTPUT:  850 ml SPECIMENS: Placenta sent to L&D COMPLICATIONS: None immediate  PROCEDURE IN DETAIL:  The patient preoperatively received intravenous antibiotics and had sequential compression devices applied to her lower extremities.  She was then taken to the operating room where spinal anesthesia was administered and was found to be adequate. She was then placed in a dorsal supine position with a leftward tilt, and prepped and draped in a sterile manner.  A foley catheter was placed into her bladder and attached to constant gravity.  After an adequate timeout was performed, a Pfannenstiel skin incision was made with scalpel on her preexisting scar and carried through to the underlying layer of fascia.  The fascia was incised in the midline, and this incision was extended bilaterally using the Mayo scissors. The underlying rectus muscles were dissected off the fascia superiorly and inferiorly in a blunt fashion. Kocher clamps were applied to the superior aspect of the fascial incision and the underlying rectus muscles were dissected off bluntly and sharply. The peritoneum was entered sharply with metzenbaums and bluntly with hemostat. The Alexis self-retaining retractor was introduced into the abdominal cavity.  Attention was turned to the lower uterine segment where a low transverse hysterotomy was made with a scalpel and extended bilaterally bluntly. The infant was found to be occiput anterior. Following 2 attempts to flex the head to the hysterotomy, a vacuum was successfully used to deliver the head. The infant was successfully delivered, the cord was clamped and cut after 30 seconds, and the infant was handed over to the awaiting neonatology team. Uterine massage was then administered, and the placenta delivered intact with a three-vessel cord. The uterus was then cleared of clots and debris.  The hysterotomy was closed with 0 Vicryl in a running locked fashion. The  pelvis was cleared of all clot and  debris. Hemostasis was confirmed on all surfaces.  The retractor was removed.  The peritoneum was closed with a 0 Vicryl running stitch. The fascia was then closed using 0 Vicryl in a running fashion.  The subcutaneous layer was irrigated, and the skin was closed with a Lanny Hurst needle subcuticular stitch. Surgical glue neatly applied to the overlying closed skin incision. The patient tolerated the procedure well. Sponge, instrument and needle counts were correct x 3.  She was taken to the recovery room in stable condition.   Gerlene Fee, DO OB Fellow, [facility] 03/27/2022, 6:00 PM

## 2022-03-27 NOTE — Transfer of Care (Addendum)
Immediate Anesthesia Transfer of Care Note  Patient: Gina Pearson  Procedure(s) Performed: CESAREAN SECTION (Abdomen)  Patient Location: PACU  Anesthesia Type:Spinal  Level of Consciousness: awake  Airway & Oxygen Therapy: Patient Spontanous Breathing  Post-op Assessment: Report given to RN  Post vital signs: Reviewed  Last Vitals:  Vitals Value Taken Time  BP 117/69 03/27/22 1745  Temp 36.9 C 03/27/22 1715  Pulse 76 03/27/22 1758  Resp 17 03/27/22 1758  SpO2 99 % 03/27/22 1758  Vitals shown include unvalidated device data.  Last Pain:  Vitals:   03/27/22 1745  TempSrc:   PainSc: 0-No pain         Complications: No notable events documented.

## 2022-03-27 NOTE — Progress Notes (Signed)
Labor Progress Note Gina Pearson is a 26 y.o. G2P1001 at 41w4dpresented for IOL for FGR, [place] S: Patient doing well, pain well-controlled.  Does feel her contractions  O:  BP (!) 105/59   Pulse 80   Temp 99 F (37.2 C) (Axillary)   Resp 15   Ht '5\' 4"'$  (1.626 m)   Wt 68.9 kg   LMP 06/30/2021   SpO2 100%   BMI 26.09 kg/m  EFM: 135 bpm /moderate variability/15 x 15 accelerations/no decelerations  CVE: Dilation: 4 Effacement (%): 80 Station: -2 Presentation: Vertex Exam by:: Zerek Litsey, DO   A&P: 26y.o. G2P1001 325w4dOL as above #Labor: Progressing well.  SVE progressing.  Increase Pitocin, now on right lateral exaggerated SIMS #Pain: Epidural #FWB: category 1 #GBS negative   Abbe Bula Q Mercado-Ortiz, DO 4:03 AM

## 2022-03-27 NOTE — Anesthesia Procedure Notes (Signed)
Spinal  Start time: 03/27/2022 3:42 PM End time: 03/27/2022 3:45 PM Reason for block: surgical anesthesia Staffing Performed: anesthesiologist  Anesthesiologist: Effie Berkshire, MD Performed by: Effie Berkshire, MD Authorized by: Effie Berkshire, MD   Preanesthetic Checklist Completed: patient identified, IV checked, site marked, risks and benefits discussed, surgical consent, monitors and equipment checked, pre-op evaluation and timeout performed Spinal Block Patient position: sitting Prep: DuraPrep and site prepped and draped Location: L3-4 Injection technique: single-shot Needle Needle type: Pencan  Needle gauge: 24 G Needle length: 10 cm Needle insertion depth: 10 cm Additional Notes Epidural removed. Patient tolerated well. No immediate complications.  Functioning IV was confirmed and monitors were applied. Sterile prep and drape, including hand hygiene and sterile gloves were used. The patient was positioned and the back was prepped. The skin was anesthetized with lidocaine. Free flow of clear CSF was obtained prior to injecting local anesthetic into the CSF. The spinal needle aspirated freely following injection. The needle was carefully withdrawn. The patient tolerated the procedure well.

## 2022-03-27 NOTE — Discharge Summary (Signed)
Postpartum Discharge Summary  Date of Service updated***     Patient Name: Gina Pearson DOB: 1996/03/01 MRN: [id number]  Date of admission: 03/25/2022 Delivery date:03/27/2022  Delivering provider: Truett Mainland  Date of discharge: 03/27/2022  Admitting diagnosis: IUGR (intrauterine growth restriction) affecting care of mother [O36.5990] Intrauterine pregnancy: [redacted]w[redacted]d    Secondary diagnosis:  Active Problems:   Blood type, Rh negative   Low grade squamous intraepithelial lesion on cytologic smear of cervix (LGSIL)   History of cesarean section   Fetal growth restriction antepartum   Vacuum-assisted cesarean delivery, delivered, current hospitalization  Additional problems: None.    Discharge diagnosis: Term Pregnancy Delivered and Anemia                                              Post partum procedures:{Postpartum procedures:23558} Augmentation: AROM, Pitocin, and IP Foley Complications: RUKG>25hours  Hospital course: Induction of Labor With Cesarean Section   26y.o. yo [id number] 32w4das admitted to the hospital 03/25/2022 for induction of labor. Patient had a labor course significant for arrest of dilatation and >24 ROM. The patient went for cesarean section due to Arrest of Dilation. Delivery details are as follows: Membrane Rupture Time/Date: 5:09 AM ,03/26/2022   Delivery Method:C-Section, Vacuum Assisted  Details of operation can be found in separate operative Note.  Patient had an uncomplicated postpartum course. She is ambulating, tolerating a regular diet, passing flatus, and urinating well.  Patient is discharged home in stable condition on 03/27/22.      Newborn Data: Birth date:03/27/2022  Birth time:4:11 PM  Gender:Female  Living status:Living  Apgars:7 ,8  Weight:2360 g                                Magnesium Sulfate received: No BMZ received: No Rhophylac:{Rhophylac received:30440032} MMR:No T-DaP:{Tdap:23962} Flu: N/A Transfusion:{Transfusion  received:30440034}  Physical exam  Vitals:   03/27/22 1332 03/27/22 1401 03/27/22 1431 03/27/22 1715  BP: 118/80 133/78 121/79 110/64  Pulse: 92 93 100 87  Resp: _0 Temp:      TempSrc:      SpO2:    100%  Weight:      Height:       General: {Exam; general:21111117} Lochia: {Desc; appropriate/inappropriate:30686::"appropriate"} Uterine Fundus: {Desc; firm/soft:30687} Incision: {Exam; incision:21111123} DVT Evaluation: {Exam; dvt:2111122} Labs: Lab Results  Component Value Date   WBC 14.1 (H) 03/27/2022   HGB 8.3 (L) 03/27/2022   HCT 27.3 (L) 03/27/2022   MCV 77.8 (L) 03/27/2022   PLT 302 03/27/2022       No data to display         Edinburgh Score:     No data to display           After visit meds:  Allergies as of 03/27/2022       Reactions   Ceftriaxone Other (See Comments)   Patient states she was given a IM dose and immediately started seizures   Azithromycin    Other reaction(s): Airway constriction Other reaction(s): Airway constriction   Fish-derived Products    Other    [facility].   Pork-derived Products    Sumatriptan    Other reaction(s):  Dysphagia Other reaction(s): Dysphagia     Med Rec must be completed prior to using this [facility]***        Discharge home in stable condition Infant Feeding: Breast Infant Disposition:home with mother Discharge instruction: per After Visit Summary and Postpartum booklet. Activity: Advance as tolerated. Pelvic rest for 6 weeks.  Diet: routine diet Future Appointments: Future Appointments  Date Time Provider Tina  04/30/2022  1:30 PM Guss Bunde, MD [facility]   Follow up Visit:  Message sent to [facility] by Naaman Plummer Autry-Lott  Please schedule this patient for a In person postpartum visit in 4 weeks with the following provider: MD and APP. Additional Postpartum F/U:Incision check 1  week  Low risk pregnancy complicated by:  FGR Delivery mode:  C-Section, Vacuum Assisted  Anticipated Birth Control:   Declined   03/27/2022 Simone Autry-Lott, DO

## 2022-03-28 ENCOUNTER — Encounter (HOSPITAL_COMMUNITY): Payer: Self-pay | Admitting: Family Medicine

## 2022-03-28 DIAGNOSIS — D509 Iron deficiency anemia, unspecified: Secondary | ICD-10-CM

## 2022-03-28 DIAGNOSIS — D62 Acute posthemorrhagic anemia: Secondary | ICD-10-CM

## 2022-03-28 LAB — CBC
HCT: 24.1 % — ABNORMAL LOW (ref 36.0–46.0)
Hemoglobin: 7.8 g/dL — ABNORMAL LOW (ref 12.0–15.0)
MCH: 25.2 pg — ABNORMAL LOW (ref 26.0–34.0)
MCHC: 32.4 g/dL (ref 30.0–36.0)
MCV: 77.7 fL — ABNORMAL LOW (ref 80.0–100.0)
Platelets: 258 10*3/uL (ref 150–400)
RBC: 3.1 MIL/uL — ABNORMAL LOW (ref 3.87–5.11)
RDW: 15.2 % (ref 11.5–15.5)
WBC: 14.5 10*3/uL — ABNORMAL HIGH (ref 4.0–10.5)
nRBC: 0 % (ref 0.0–0.2)

## 2022-03-28 LAB — PREPARE RBC (CROSSMATCH)

## 2022-03-28 LAB — ABO/RH: ABO/RH(D): O NEG

## 2022-03-28 MED ORDER — RHO D IMMUNE GLOBULIN 1500 UNIT/2ML IJ SOSY
300.0000 ug | PREFILLED_SYRINGE | Freq: Once | INTRAMUSCULAR | Status: AC
  Administered 2022-03-28: 300 ug via INTRAVENOUS
  Filled 2022-03-28: qty 2

## 2022-03-28 MED ORDER — NALBUPHINE HCL 10 MG/ML IJ SOLN: 5.0000 mg | INTRAMUSCULAR | Status: DC | PRN

## 2022-03-28 MED ORDER — SODIUM CHLORIDE 0.9% IV SOLUTION: Freq: Once | INTRAVENOUS | Status: DC

## 2022-03-28 MED ORDER — SODIUM CHLORIDE 0.9 % IV SOLN
500.0000 mg | Freq: Once | INTRAVENOUS | Status: AC
  Administered 2022-03-28: 500 mg via INTRAVENOUS
  Filled 2022-03-28: qty 25
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
## 2022-03-28 NOTE — Lactation Note (Addendum)
This note was copied from a baby's chart. Lactation Consultation Note  Patient Name: Gina Pearson ZLDJT'T Date: 03/28/2022 Reason for consult: Follow-up assessment;Mother's request;Early term 37-38.6wks;Infant < 6lbs Age:25 hours  LC assisted with latch with signs of milk transfer. Birth parent questions about triamcinolone cream used for eczema, listed [place] (L3). Birth parent encouraged to talk with provider about use of APNO .  Birth parent encouraged to pump after each feeding as stated below.  Plan 1. To feed based on cues 8-12x 24 hr period. Birth parent to offer breasts and look for signs of milk transfer.  2. Birth parent to supplement with EBM first 17 ml or more per feeding.  3. Post pump after feeding for 15 mins All questions answered at the end of the visit.  Birth parent as elvee pump at home.   Maternal Data Has patient been taught Hand Expression?: Yes  Feeding Mother's Current Feeding Choice: Breast Milk  LATCH Score Latch: Repeated attempts needed to sustain latch, nipple held in mouth throughout feeding, stimulation needed to elicit sucking reflex.  Audible Swallowing: Spontaneous and intermittent  Type of Nipple: Everted at rest and after stimulation  Comfort (Breast/Nipple): Soft / non-tender  Hold (Positioning): Assistance needed to correctly position infant at breast and maintain latch.  LATCH Score: 8   Lactation Tools Discussed/Used Tools: Pump;Flanges Flange Size: 24 Breast pump type: Double-Electric Breast Pump Pump Education: Setup, frequency, and cleaning;Milk Storage Reason for Pumping: increase stimulation Pumping frequency: post pump after feeding for 15 mins  Interventions Interventions: Breast feeding basics reviewed;Assisted with latch;Skin to skin;Breast massage;Hand express;Breast compression;Adjust position;Support pillows;Position options;Expressed milk;DEBP;Education;LC Services brochure;Infant Driven Feeding Algorithm  education;LPT handout/interventions  Discharge Pump: DEBP WIC Program: Yes  Consult Status Consult Status: Follow-up Date: 03/29/22 Follow-up type: In-patient    Gina Pearson  Gina Pearson 03/28/2022, 2:22 PM

## 2022-03-28 NOTE — Progress Notes (Signed)
POSTPARTUM PROGRESS NOTE  POD #1  Subjective:  Gina Pearson is a 26 y.o. [id number] s/p repeat LTCS at [redacted]w[redacted]d  She reports she doing well. No acute events overnight. She reports she is doing well. She denies any problems with ambulating, voiding or po intake. Denies nausea or vomiting. She has passed flatus. Pain is well controlled.  Lochia is minimal.  Objective: Blood pressure 114/70, pulse 80, temperature 98.2 F (36.8 C), temperature source Oral, resp. rate 18, height '5\' 4"'$  (1.626 m), weight 68.9 kg, last menstrual period 06/30/2021, SpO2 100 %, unknown if currently breastfeeding.  Physical Exam:  General: alert, cooperative and no distress Chest: no respiratory distress Heart:regular rate, distal pulses intact Abdomen: soft, nontender,  Uterine Fundus: firm, appropriately tender DVT Evaluation: No calf swelling or tenderness Extremities: no edema Skin: warm, dry; incision clean/dry/intact w/honeycomb dressing in place  Recent Labs    03/27/22 1043 03/28/22 0516  HGB 8.3* 7.8*  HCT 27.3* 24.1*    Assessment/Plan: Gina Roblerois a 26y.o. [id number]/p rLTCS at 329w4dor Failure to progress, arrest of dilation and FGR.  POD#1 - Doing welll; pain is well controlled.   Routine postpartum care  OOB, ambulated  Lovenox for VTE prophylaxis  Anemia: asymptomatic, hgb <8 today  IV Venofer '500mg'$  ordered today Start po ferrous sulfate 325 every other at discharge   Contraception: declines Feeding: breast feeding  Dispo: Plan for discharge tomorrow.   LOS: 3 days   ChLiliane ChannelD MPH OB Fellow, [facility]

## 2022-03-28 NOTE — Progress Notes (Signed)
Subjective: Postpartum Day 1: Cesarean Delivery Patient reports tolerating PO, + flatus, and no problems voiding.   Patient and family members state patient's arm was swollen and painful at IV site overnight. Patient requests at right forearm IV access be d/c and new IV started  Objective: Vital signs in last 24 hours: Temp:  [98 F (36.7 C)-98.5 F (36.9 C)] 98.2 F (36.8 C) (08/09 0500) Pulse Rate:  [60-100] 88 (08/09 1327) Resp:  [11-23] 18 (08/09 0500) BP: (102-136)/(62-86) 127/82 (08/09 1327) SpO2:  [98 %-100 %] 100 % (08/09 1327)  Physical Exam:  General: alert, cooperative, and appears stated age 26: appropriate Uterine Fundus: firm  Recent Labs    03/27/22 1043 03/28/22 0516  HGB 8.3* 7.8*  HCT 27.3* 24.1*    Assessment/Plan: --CNM at bedside due to RN report of patient seizure with Venofer --Patient A&O x 4, not post-ictal, pulse WNL, no SOB --Patient verbally consented for 1 unit PRBCs --Apology offered for patient reported refusal to remove IV and restart at new site --Otherwise recovering well   Darlina Rumpf, CNM 03/28/2022, 2:00 PM

## 2022-03-28 NOTE — Progress Notes (Signed)
Patient called out regarding pain at IV site with iron infusing.  RN stopped the infusion and before walking out, patient started to have seizure like activity, but was able to hear and slightly respond. Assistance called to bedside. Vital signs obtained (below).  Family is at bedside that say "stress and anxiety brings on these pseudo-seizures".  Patient alert and oriented, VSS.   Patient Seizures pads placed.    03/28/22 1320  Vital Signs  BP 136/86  BP Location Left Arm  Patient Position (if appropriate) Semi-fowlers  BP Method Automatic  Pulse Rate 100  Pulse Rate Source Dinamap  Oxygen Therapy  SpO2 98 %

## 2022-03-28 NOTE — Anesthesia Postprocedure Evaluation (Signed)
Anesthesia Post Note  Patient: Gina Pearson  Procedure(s) Performed: CESAREAN SECTION (Abdomen)     Patient location during evaluation: Mother Baby Anesthesia Type: Epidural Level of consciousness: awake and alert and oriented Pain management: satisfactory to patient Vital Signs Assessment: post-procedure vital signs reviewed and stable Respiratory status: respiratory function stable Cardiovascular status: stable Postop Assessment: no headache, no backache, epidural receding, patient able to bend at knees, no signs of nausea or vomiting, adequate PO intake and able to ambulate Anesthetic complications: no   No notable events documented.  Last Vitals:  Vitals:   03/28/22 0200 03/28/22 0500  BP: 116/72 114/70  Pulse: 70 80  Resp: 18 18  Temp: 36.7 C 36.8 C  SpO2:      Last Pain:  Vitals:   03/28/22 0745  TempSrc:   PainSc: 0-No pain   Pain Goal:                   [name]

## 2022-03-29 LAB — RH IG WORKUP (INCLUDES ABO/RH)
Fetal Screen: NEGATIVE
Gestational Age(Wks): 38.4
Unit division: 0

## 2022-03-29 LAB — TYPE AND SCREEN
ABO/RH(D): O NEG
Antibody Screen: NEGATIVE
Unit division: 0

## 2022-03-29 LAB — CBC
HCT: 27.9 % — ABNORMAL LOW (ref 36.0–46.0)
Hemoglobin: 8.8 g/dL — ABNORMAL LOW (ref 12.0–15.0)
MCH: 24.9 pg — ABNORMAL LOW (ref 26.0–34.0)
MCHC: 31.5 g/dL (ref 30.0–36.0)
MCV: 79 fL — ABNORMAL LOW (ref 80.0–100.0)
Platelets: 298 10*3/uL (ref 150–400)
RBC: 3.53 MIL/uL — ABNORMAL LOW (ref 3.87–5.11)
RDW: 15.1 % (ref 11.5–15.5)
WBC: 14.3 10*3/uL — ABNORMAL HIGH (ref 4.0–10.5)
nRBC: 0 % (ref 0.0–0.2)

## 2022-03-29 LAB — BPAM RBC
Blood Product Expiration Date: 202308132359
ISSUE DATE / TIME: 202308091446
Unit Type and Rh: 9500

## 2022-03-29 MED ORDER — FERROUS SULFATE 325 (65 FE) MG PO TBEC: 325.0000 mg | DELAYED_RELEASE_TABLET | ORAL | 1 refills | Status: AC

## 2022-03-29 MED ORDER — IBUPROFEN 600 MG PO TABS: 600.0000 mg | ORAL_TABLET | Freq: Four times a day (QID) | ORAL | 0 refills | Status: AC | PRN

## 2022-03-29 MED ORDER — POLYETHYLENE GLYCOL 3350 17 GM/SCOOP PO POWD: 17.0000 g | Freq: Every day | ORAL | 0 refills | Status: AC | PRN

## 2022-03-29 MED ORDER — OXYCODONE HCL 5 MG PO TABS: 5.0000 mg | ORAL_TABLET | ORAL | 0 refills | Status: AC | PRN

## 2022-03-29 NOTE — Discharge Instructions (Signed)
Take ibuprofen and tylenol scheduled every 8hrs, with food for the next 5 days to help with pain  Take oxycodone for breakthrough pain  Try to keep stools soft, by keeping well hydrated, eating lots of fibre and using a stool softener.

## 2022-04-03 ENCOUNTER — Ambulatory Visit (INDEPENDENT_AMBULATORY_CARE_PROVIDER_SITE_OTHER): Payer: No Typology Code available for payment source | Admitting: *Deleted

## 2022-04-03 ENCOUNTER — Other Ambulatory Visit: Payer: Self-pay | Admitting: Obstetrics and Gynecology

## 2022-04-03 VITALS — BP 131/86 | HR 98 | Ht 66.0 in | Wt 145.0 lb

## 2022-04-03 DIAGNOSIS — R399 Unspecified symptoms and signs involving the genitourinary system: Secondary | ICD-10-CM | POA: Diagnosis not present

## 2022-04-03 LAB — POCT URINALYSIS DIPSTICK
Glucose, UA: NEGATIVE
Ketones, UA: NEGATIVE
Nitrite, UA: NEGATIVE
Protein, UA: NEGATIVE
Spec Grav, UA: 1.015 (ref 1.010–1.025)
Urobilinogen, UA: NEGATIVE E.U./dL — AB
pH, UA: 6.5 (ref 5.0–8.0)

## 2022-04-03 NOTE — Progress Notes (Cosign Needed)
Pt here for 1 week post c-section incision check.  Pt had taken off honeycomb dressing.  Incision is clean,dry and intact with dermabond.  Pt does c/o of pain after urination.  POC urinalysis is shows large blood which is normal and mod leukecytes but no nitrites.  UA C&S sent to lab.  Pt will return in 4 weeks for routine PP visit or PRN.  Per J Rasch,NP patient to try drinking pure cranberry juice for the urinary issue.

## 2022-04-05 ENCOUNTER — Encounter: Payer: No Typology Code available for payment source | Admitting: Obstetrics and Gynecology

## 2022-04-06 ENCOUNTER — Other Ambulatory Visit: Payer: Self-pay | Admitting: Obstetrics and Gynecology

## 2022-04-06 LAB — URINE CULTURE

## 2022-04-06 MED ORDER — NITROFURANTOIN MONOHYD MACRO 100 MG PO CAPS: 100.0000 mg | ORAL_CAPSULE | Freq: Two times a day (BID) | ORAL | 0 refills | Status: AC

## 2022-04-06 NOTE — Progress Notes (Signed)
+   Urine culture Called patient. Rx: Macrobid x 7 days  Noni Saupe I, NP 04/06/2022 7:37 PM

## 2022-04-06 NOTE — Progress Notes (Signed)
Chart reviewed for nurse visit. Agree with plan of care.   Lezlie Lye, NP 04/06/2022 7:11 PM

## 2022-04-24 ENCOUNTER — Ambulatory Visit: Payer: No Typology Code available for payment source

## 2022-04-30 ENCOUNTER — Telehealth (INDEPENDENT_AMBULATORY_CARE_PROVIDER_SITE_OTHER): Payer: No Typology Code available for payment source | Admitting: Obstetrics & Gynecology

## 2022-04-30 ENCOUNTER — Encounter: Payer: Self-pay | Admitting: Obstetrics & Gynecology

## 2022-04-30 DIAGNOSIS — Z1332 Encounter for screening for maternal depression: Secondary | ICD-10-CM

## 2022-04-30 NOTE — Progress Notes (Signed)
Provider location: [facility] [place]   Patient location: Home  I connected withNAME@ on 04/30/22 at  1:30 PM EDT by Mychart Video Encounter and verified that I am speaking with the correct person using two identifiers.       I discussed the limitations, risks, security and privacy concerns of performing an evaluation and management service virtually and the availability of in person appointments. I also discussed with the patient that there may be a patient responsible charge related to this service. The patient expressed understanding and agreed to proceed.  Post Partum Visit Note Subjective:   Gina Pearson is a 26 y.o. [phone] female who presents for a postpartum visit. She is 4 weeks postpartum following a primary cesarean section.  I have fully reviewed the prenatal and intrapartum course. The delivery was at 38.4 gestational weeks.  Anesthesia: spinal. Postpartum course has been unremarkable. Baby is doing well. Baby is feeding by breast. Bleeding no bleeding. Bowel function is normal. Bladder function is normal. Patient is not sexually active. Contraception method is none. Postpartum depression screening: negative.  Pt having pain on left side deep to incision.  Reviewed op note and no mention of excessive scar tissue. Pt seen at [facility] and thought to have strep throar.    The pregnancy intention screening data noted above was reviewed. Potential methods of contraception were discussed. The patient elected to proceed with No data recorded.   The following portions of the patient's history were reviewed and updated as appropriate: allergies, current medications, past family history, past medical history, past social history, past surgical history, and problem list.  Review of Systems Pertinent items noted in HPI and remainder of comprehensive ROS otherwise negative.  Objective:  LMP 06/30/2021     General:  Alert, oriented and cooperative. Patient is in  no acute distress.  Respiratory: Normal respiratory effort, no problems with respiration noted  Mental Status: Normal mood and affect. Normal behavior. Normal judgment and thought content.  Rest of physical exam deferred due to type of encounter   Assessment:    normal postpartum exam via webex.  Plan:  Essential components of care per ACOG recommendations:  1.  Mood and well being: Patient with negative depression screening today. Reviewed local resources for support.  - No smoking or drugs  2. Infant care and feeding:  -Patient currently breastmilk feeding? Yes  Discussed return to work and pumping. If needed, patient was provided letter for work to allow for every 2-3 hr pumping breaks, and to be granted a private location to express breastmilk and refrigerated area to store breastmilk. Reviewed importance of draining breast regularly to support lactation. -Social determinants of health (SDOH) reviewed in EPIC. No concerns.  3. Sexuality, contraception and birth spacing - Patient does not want a pregnancy in the next year.   - Reviewed forms of contraception in tiered fashion. Patient desired abstinence today.   - Discussed birth spacing of 18 months  4. Sleep and fatigue -Encouraged family/partner/community support of 4 hrs of uninterrupted sleep to help with mood and fatigue  5. Physical Recovery  - Discussed patients delivery - Patient had a c/s after laboring--got to 5 cm. - Patient has urinary incontinence? No - Patient having left sided abdominal pain.  If still present in 2 weeks, she will make an appto to be examined as well as cut the knot/string she says is coming out of her incision.  She noted that her pain during labor was also  left sided.   6.  Health Maintenance - Last pap smear done 2022 needs pap smear     No follow-ups on file.  Future Appointments  Date Time Provider [place]  04/30/2022  1:30 PM Guss Bunde, MD [place], [place] for [employer], [place]

## 2023-01-09 ENCOUNTER — Other Ambulatory Visit: Payer: Self-pay | Admitting: Family Medicine

## 2023-01-09 DIAGNOSIS — N920 Excessive and frequent menstruation with regular cycle: Secondary | ICD-10-CM

## 2023-01-18 ENCOUNTER — Other Ambulatory Visit: Payer: No Typology Code available for payment source

## 2023-01-21 ENCOUNTER — Other Ambulatory Visit: Payer: No Typology Code available for payment source

## 2024-01-30 ENCOUNTER — Emergency Department (HOSPITAL_COMMUNITY)

## 2024-01-30 ENCOUNTER — Other Ambulatory Visit: Payer: Self-pay

## 2024-01-30 ENCOUNTER — Emergency Department (HOSPITAL_COMMUNITY)
Admission: EM | Admit: 2024-01-30 | Discharge: 2024-01-30 | Disposition: A | Discharge status: Home / Self Care | Attending: Emergency Medicine | Admitting: Emergency Medicine

## 2024-01-30 DIAGNOSIS — R0789 Other chest pain: Secondary | ICD-10-CM | POA: Diagnosis present

## 2024-01-30 LAB — CBC
HCT: 34.3 % — ABNORMAL LOW (ref 36.0–46.0)
Hemoglobin: 10.8 g/dL — ABNORMAL LOW (ref 12.0–15.0)
MCH: 25.5 pg — ABNORMAL LOW (ref 26.0–34.0)
MCHC: 31.5 g/dL (ref 30.0–36.0)
MCV: 80.9 fL (ref 80.0–100.0)
Platelets: 407 10*3/uL — ABNORMAL HIGH (ref 150–400)
RBC: 4.24 MIL/uL (ref 3.87–5.11)
RDW: 14 % (ref 11.5–15.5)
WBC: 5.5 10*3/uL (ref 4.0–10.5)
nRBC: 0 % (ref 0.0–0.2)

## 2024-01-30 LAB — BASIC METABOLIC PANEL WITH GFR
Anion gap: 9 (ref 5–15)
BUN: 6 mg/dL (ref 6–20)
CO2: 22 mmol/L (ref 22–32)
Calcium: 9.2 mg/dL (ref 8.9–10.3)
Chloride: 107 mmol/L (ref 98–111)
Creatinine, Ser: 0.84 mg/dL (ref 0.44–1.00)
GFR, Estimated: >60 mL/min (ref >60)
Glucose, Bld: 95 mg/dL (ref 70–99)
Potassium: 3.9 mmol/L (ref 3.5–5.1)
Sodium: 138 mmol/L (ref 135–145)

## 2024-01-30 LAB — HCG, SERUM, QUALITATIVE: Preg, Serum: NEGATIVE

## 2024-01-30 LAB — TROPONIN I (HIGH SENSITIVITY)
Troponin I (High Sensitivity): <2 ng/L (ref <18)
Troponin I (High Sensitivity): 2 ng/L (ref <18)

## 2024-01-30 MED ORDER — LIDOCAINE VISCOUS HCL 2 % MT SOLN
15.0000 mL | Freq: Once | OROMUCOSAL | Status: AC
  Administered 2024-01-30: 15 mL via ORAL
  Filled 2024-01-30: qty 15

## 2024-01-30 MED ORDER — ACETAMINOPHEN 325 MG PO TABS
650.0000 mg | ORAL_TABLET | Freq: Once | ORAL | Status: AC
  Administered 2024-01-30: 650 mg via ORAL
  Filled 2024-01-30: qty 2

## 2024-01-30 MED ORDER — ALUM & MAG HYDROXIDE-SIMETH 200-200-20 MG/5ML PO SUSP
30.0000 mL | Freq: Once | ORAL | Status: AC
  Administered 2024-01-30: 30 mL via ORAL
  Filled 2024-01-30: qty 30

## 2024-01-30 NOTE — ED Triage Notes (Signed)
 Pt. Stated, I started having chest pain last night and its constant since last night. Denies any other symptoms.

## 2024-01-30 NOTE — Discharge Instructions (Signed)

## 2024-01-30 NOTE — ED Provider Notes (Signed)
 [facility] DEPART[facility] AT [facility] Provider Note   CSN: [id number] Arrival date & time: 01/30/24  1158     Patient presents with: Chest Pain   Gina Pearson is a 28 y.o. female with a past medical history of acid reflux and hiatal hernia presents emergency department with chief complaint of chest pain.  Patient reports that last night she had a snack of tomatoes and cucumbers.  She reports that as soon as she ate it she got very sharp chest pain radiating to the left side.  She reports that since that time she has constant achy pain that occasionally becomes sharp and radiates to the left.  She states it feels like something is stuck.  She has never had pain like this before she has had episodes of getting food stuck with swallowing but it is very rare and she does not feel like there is anything lodged in her esophagus right now.  She denies exertional shortness of breath vomiting unilateral leg swelling, use of exogenous estrogens, history of DVT PE or cancer.  She has no recent surgeries.    Chest Pain      Prior to Admission medications   Medication Sig Start Date End Date Taking? Authorizing Provider  budesonide (RHINOCORT AQUA) 32 MCG/ACT nasal spray Place 2 sprays into both nostrils daily. Patient not taking: Reported on 03/05/2022 01/03/22   [provider]  butalbital -acetaminophen -caffeine  (FIORICET) 50-325-40 MG tablet Take 1 tablet every 6 hours for migraine headache not to exceed 5 days a month. Patient not taking: Reported on 04/30/2022 10/19/21   Rik Chasten, MD  cetirizine (ZYRTEC) 10 MG tablet Take 10 mg by mouth daily. Patient not taking: Reported on 04/30/2022 01/03/22   [provider]  ferrous sulfate  325 (65 FE) MG EC tablet Take 1 tablet (325 mg total) by mouth every other day. Patient not taking: Reported on 04/30/2022 03/29/22 09/25/22  Ndulue, Chiagoziem J, MD  FIBER ADULT GUMMIES PO Take by mouth. Patient not taking:  Reported on 04/30/2022    [provider]  ibuprofen  (ADVIL ) 600 MG tablet Take 1 tablet (600 mg total) by mouth every 6 (six) hours as needed for moderate pain. Patient not taking: Reported on 04/30/2022 03/29/22   Ndulue, Chiagoziem J, MD  oxyCODONE  (OXY IR/ROXICODONE ) 5 MG immediate release tablet Take 1-2 tablets (5-10 mg total) by mouth every 4 (four) hours as needed for moderate pain. Patient not taking: Reported on 04/30/2022 03/29/22   Ndulue, Chiagoziem J, MD  polyethylene glycol powder (GLYCOLAX /MIRALAX ) 17 GM/SCOOP powder Take 17 g by mouth daily as needed. Patient not taking: Reported on 04/30/2022 03/29/22   Ndulue, Chiagoziem J, MD    Allergies: Azithromycin, Ceftriaxone, Beef-derived drug products, Fish-derived products, Other, Pork-derived products, and Sumatriptan    Review of Systems  Cardiovascular:  Positive for chest pain.    Updated Vital Signs BP 122/87 (BP Location: Right Arm)   Pulse 82   Temp 98.4 F (36.9 C)   Resp 16   Ht 5' 4 (1.626 m)   Wt 68.5 kg   LMP 01/28/2024 (Approximate)   SpO2 100%   BMI 25.92 kg/m   Physical Exam Vitals and nursing note reviewed.  Constitutional:      General: She is not in acute distress.    Appearance: She is well-developed. She is not diaphoretic.  HENT:     Head: Normocephalic and atraumatic.     Right Ear: External ear normal.     Left  Ear: External ear normal.     Nose: Nose normal.     Mouth/Throat:     Mouth: Mucous membranes are moist.   Eyes:     General: No scleral icterus.    Conjunctiva/sclera: Conjunctivae normal.    Cardiovascular:     Rate and Rhythm: Normal rate and regular rhythm.     Heart sounds: Normal heart sounds. No murmur heard.    No friction rub. No gallop.  Pulmonary:     Effort: Pulmonary effort is normal. No respiratory distress.     Breath sounds: Normal breath sounds.  Chest:     Chest wall: Tenderness present.   Abdominal:     General: Bowel sounds are normal. There is  no distension.     Palpations: Abdomen is soft. There is no mass.     Tenderness: There is no abdominal tenderness. There is no guarding.   Musculoskeletal:     Cervical back: Normal range of motion.   Skin:    General: Skin is warm and dry.   Neurological:     Mental Status: She is alert and oriented to person, place, and time.   Psychiatric:        Behavior: Behavior normal.     (all labs ordered are listed, but only abnormal results are displayed) Labs Reviewed  CBC - Abnormal; Notable for the following components:      Result Value   Hemoglobin 10.8 (*)    HCT 34.3 (*)    MCH 25.5 (*)    Platelets 407 (*)    All other components within normal limits  BASIC METABOLIC PANEL WITH GFR  HCG, SERUM, QUALITATIVE  TROPONIN I (HIGH SENSITIVITY)  TROPONIN I (HIGH SENSITIVITY)    EKG: None  Radiology: DG Chest 2 View Result Date: 01/30/2024 CLINICAL DATA:  Chest pain. EXAM: CHEST - 2 VIEW COMPARISON:  None Available. FINDINGS: The heart size and mediastinal contours are within normal limits. Both lungs are clear. The visualized skeletal structures are unremarkable. IMPRESSION: No active cardiopulmonary disease. Electronically Signed   By: Angus Bark M.D.   On: 01/30/2024 13:15     Procedures   Medications Ordered in the ED  alum & mag hydroxide-simeth (MAALOX/MYLANTA) 200-200-20 MG/5ML suspension 30 mL (30 mLs Oral Given 01/30/24 1627)    And  lidocaine  (XYLOCAINE ) 2 % viscous mouth solution 15 mL (15 mLs Oral Given 01/30/24 1627)  acetaminophen  (TYLENOL ) tablet 650 mg (650 mg Oral Given 01/30/24 1626)    Clinical Course as of 01/30/24 2237  Thu Jan 30, 2024  1550 DG Chest 2 View [AH]  1551 EKG shows NSR at a rate of 79 [AH]  1740 Troponin I (High Sensitivity) Trop negative x 2 [AH]  1740 PERC negative [AH]    Clinical Course User Index [AH] Tama Fails, PA-C                                 Medical Decision Making Amount and/or Complexity of Data  Reviewed Labs: ordered. Decision-making details documented in ED Course. Radiology: ordered. Decision-making details documented in ED Course.  Risk OTC drugs. Prescription drug management.   This patient presents to the ED for concern of chest pain, this involves an extensive number of treatment options, and is a complaint that carries with it a high risk of complications and morbidity.  The emergent differential diagnosis of chest pain includes: Acute coronary syndrome, pericarditis, aortic dissection,  pulmonary embolism, tension pneumothorax, pneumonia, and esophageal rupture.   Co morbidities:   has a past medical history of , hiatal hernia,Anal fissure, Anal fissure, Anemia, Complication of anesthesia, Hiatal hernia, Migraines, PTSD (post-traumatic stress disorder), Seizures (HCC), and Uterine fibroids affecting pregnancy in second trimester.   Social Determinants of Health:    SDOH Screenings   Social Connections: Unknown (01/02/2022)   Received from [facility]  Tobacco Use: Low Risk  (08/03/2022)   Received from [facility]      Additional history:   Lab Tests:  I Ordered, and personally interpreted labs.  The pertinent results include:   CBC with mild anemia, troponins negative x 2, normal BMP  Imaging Studies:  I ordered imaging studies including two-view chest x-ray I independently visualized and interpreted imaging which showed no acute findings I agree with the radiologist interpretation  Cardiac Monitoring/ECG:  The patient was maintained on a cardiac monitor.  I personally viewed and interpreted the cardiac monitored which showed an underlying rhythm of:  Normal sinus rhythm at a rate of 79 Medicines ordered and prescription drug management:  I ordered medication including  Medications  alum & mag hydroxide-simeth (MAALOX/MYLANTA) 200-200-20 MG/5ML suspension 30 mL (30 mLs Oral Given 01/30/24 1627)    And  lidocaine  (XYLOCAINE ) 2 %  viscous mouth solution 15 mL (15 mLs Oral Given 01/30/24 1627)  acetaminophen  (TYLENOL ) tablet 650 mg (650 mg Oral Given 01/30/24 1626)   for suspected esophageal spasm Reevaluation of the patient after these medicines showed that the patient resolved I have reviewed the patients home medicines and have made adjustments as needed  Test Considered:   considered CT angiogram however patient is PERC negative  Critical Interventions:    Consultations Obtained:   Problem List / ED Course:     ICD-10-CM   1. Atypical chest pain  R07.89       MDM: Patient here with complaint of chest pain.  She is PERC negative, no evidence of ACS pneumonia pneumothorax or other emergent cause of her symptoms.  Suspect esophageal spasm in the setting of chronic reflux and hiatal hernia.  Patient advised to follow-up with GI.  Hemodynamically stable and appropriate for discharge at this time   Dispostion:  After consideration of the diagnostic results and the patients response to treatment, I feel that the patent would benefit from discharge.      Final diagnoses:  Atypical chest pain    ED Discharge Orders     None          Tama Fails, PA-C 01/30/24 2242    Lowery Rue, DO 01/30/24 2249

## 2024-01-30 NOTE — ED Notes (Signed)
 Extra urine sent to main lab

## 2024-03-20 ENCOUNTER — Emergency Department (HOSPITAL_COMMUNITY)

## 2024-03-20 ENCOUNTER — Encounter (HOSPITAL_COMMUNITY): Payer: Self-pay | Admitting: Emergency Medicine

## 2024-03-20 ENCOUNTER — Emergency Department (HOSPITAL_COMMUNITY)
Admission: EM | Admit: 2024-03-20 | Discharge: 2024-03-20 | Disposition: A | Discharge status: Home / Self Care | Attending: Emergency Medicine | Admitting: Emergency Medicine

## 2024-03-20 ENCOUNTER — Other Ambulatory Visit: Payer: Self-pay

## 2024-03-20 DIAGNOSIS — R1032 Left lower quadrant pain: Secondary | ICD-10-CM | POA: Insufficient documentation

## 2024-03-20 DIAGNOSIS — K529 Noninfective gastroenteritis and colitis, unspecified: Secondary | ICD-10-CM

## 2024-03-20 DIAGNOSIS — R110 Nausea: Secondary | ICD-10-CM | POA: Insufficient documentation

## 2024-03-20 DIAGNOSIS — R197 Diarrhea, unspecified: Secondary | ICD-10-CM | POA: Insufficient documentation

## 2024-03-20 DIAGNOSIS — R0789 Other chest pain: Secondary | ICD-10-CM | POA: Diagnosis not present

## 2024-03-20 DIAGNOSIS — R1012 Left upper quadrant pain: Secondary | ICD-10-CM | POA: Diagnosis not present

## 2024-03-20 LAB — CBC WITH DIFFERENTIAL/PLATELET
Abs Immature Granulocytes: 0.01 K/uL (ref 0.00–0.07)
Basophils Absolute: 0 K/uL (ref 0.0–0.1)
Basophils Relative: 0 %
Eosinophils Absolute: 0.2 K/uL (ref 0.0–0.5)
Eosinophils Relative: 3 %
HCT: 34.2 % — ABNORMAL LOW (ref 36.0–46.0)
Hemoglobin: 11.1 g/dL — ABNORMAL LOW (ref 12.0–15.0)
Immature Granulocytes: 0 %
Lymphocytes Relative: 33 %
Lymphs Abs: 2.5 K/uL (ref 0.7–4.0)
MCH: 25.6 pg — ABNORMAL LOW (ref 26.0–34.0)
MCHC: 32.5 g/dL (ref 30.0–36.0)
MCV: 78.8 fL — ABNORMAL LOW (ref 80.0–100.0)
Monocytes Absolute: 0.6 K/uL (ref 0.1–1.0)
Monocytes Relative: 8 %
Neutro Abs: 4.2 K/uL (ref 1.7–7.7)
Neutrophils Relative %: 56 %
Platelets: 377 K/uL (ref 150–400)
RBC: 4.34 MIL/uL (ref 3.87–5.11)
RDW: 14.7 % (ref 11.5–15.5)
WBC: 7.6 K/uL (ref 4.0–10.5)
nRBC: 0 % (ref 0.0–0.2)

## 2024-03-20 LAB — URINALYSIS, ROUTINE W REFLEX MICROSCOPIC
Bilirubin Urine: NEGATIVE
Glucose, UA: NEGATIVE mg/dL
Hgb urine dipstick: NEGATIVE
Ketones, ur: NEGATIVE mg/dL
Leukocytes,Ua: NEGATIVE
Nitrite: NEGATIVE
Protein, ur: NEGATIVE mg/dL
Specific Gravity, Urine: 1.026 (ref 1.005–1.030)
pH: 5 (ref 5.0–8.0)

## 2024-03-20 LAB — COMPREHENSIVE METABOLIC PANEL WITH GFR
ALT: 17 U/L (ref 0–44)
AST: 19 U/L (ref 15–41)
Albumin: 3.5 g/dL (ref 3.5–5.0)
Alkaline Phosphatase: 55 U/L (ref 38–126)
Anion gap: 9 (ref 5–15)
BUN: 8 mg/dL (ref 6–20)
CO2: 22 mmol/L (ref 22–32)
Calcium: 9.1 mg/dL (ref 8.9–10.3)
Chloride: 105 mmol/L (ref 98–111)
Creatinine, Ser: 0.8 mg/dL (ref 0.44–1.00)
GFR, Estimated: >60 mL/min (ref >60)
Glucose, Bld: 107 mg/dL — ABNORMAL HIGH (ref 70–99)
Potassium: 4.1 mmol/L (ref 3.5–5.1)
Sodium: 136 mmol/L (ref 135–145)
Total Bilirubin: 0.4 mg/dL (ref 0.0–1.2)
Total Protein: 7.3 g/dL (ref 6.5–8.1)

## 2024-03-20 LAB — HCG, SERUM, QUALITATIVE: Preg, Serum: NEGATIVE

## 2024-03-20 LAB — LIPASE, BLOOD: Lipase: 35 U/L (ref 11–51)

## 2024-03-20 LAB — D-DIMER, QUANTITATIVE: D-Dimer, Quant: 0.53 ug{FEU}/mL — ABNORMAL HIGH (ref 0.00–0.50)

## 2024-03-20 MED ORDER — ONDANSETRON HCL 4 MG/2ML IJ SOLN
4.0000 mg | Freq: Once | INTRAMUSCULAR | Status: AC
  Administered 2024-03-20: 4 mg via INTRAVENOUS
  Filled 2024-03-20: qty 2

## 2024-03-20 MED ORDER — MORPHINE SULFATE (PF) 4 MG/ML IV SOLN
4.0000 mg | Freq: Once | INTRAVENOUS | Status: AC
  Administered 2024-03-20: 4 mg via INTRAVENOUS
  Filled 2024-03-20: qty 1

## 2024-03-20 MED ORDER — OXYCODONE-ACETAMINOPHEN 5-325 MG PO TABS
1.0000 | ORAL_TABLET | Freq: Once | ORAL | Status: AC
  Administered 2024-03-20: 1 via ORAL
  Filled 2024-03-20: qty 1

## 2024-03-20 MED ORDER — SODIUM CHLORIDE 0.9 % IV BOLUS
1000.0000 mL | Freq: Once | INTRAVENOUS | Status: AC
  Administered 2024-03-20: 1000 mL via INTRAVENOUS

## 2024-03-20 MED ORDER — FAMOTIDINE IN NACL 20-0.9 MG/50ML-% IV SOLN
20.0000 mg | Freq: Once | INTRAVENOUS | Status: AC
  Administered 2024-03-20: 20 mg via INTRAVENOUS
  Filled 2024-03-20: qty 50

## 2024-03-20 MED ORDER — DICYCLOMINE HCL 20 MG PO TABS: 20.0000 mg | ORAL_TABLET | Freq: Two times a day (BID) | ORAL | 0 refills | Status: AC | PRN

## 2024-03-20 MED ORDER — LIDOCAINE 5 % EX PTCH
1.0000 | MEDICATED_PATCH | CUTANEOUS | Status: DC
  Administered 2024-03-20: 1 via TRANSDERMAL
  Filled 2024-03-20: qty 1

## 2024-03-20 MED ORDER — NAPROXEN 500 MG PO TABS: 500.0000 mg | ORAL_TABLET | Freq: Two times a day (BID) | ORAL | 0 refills | Status: DC

## 2024-03-20 MED ORDER — IOHEXOL 350 MG/ML SOLN
75.0000 mL | Freq: Once | INTRAVENOUS | Status: AC | PRN
  Administered 2024-03-20: 75 mL via INTRAVENOUS

## 2024-03-20 MED ORDER — KETOROLAC TROMETHAMINE 15 MG/ML IJ SOLN
15.0000 mg | Freq: Once | INTRAMUSCULAR | Status: AC
  Administered 2024-03-20: 15 mg via INTRAVENOUS
  Filled 2024-03-20: qty 1

## 2024-03-20 NOTE — Discharge Instructions (Addendum)
 We recommend the use of naproxen and Bentyl as prescribed for management of abdominal pain and cramping.  You may also benefit from use of an over-the-counter probiotic to help relieve diarrhea.  Follow-up with your primary care doctor in 1 week for recheck of symptoms.  You may return if symptoms persist or worsen.

## 2024-03-20 NOTE — ED Provider Notes (Signed)
 8:15 AM Patient care assumed in sign out from [place], [place]. Patient reassessed and reports persistent pain. She points to the LUQ and left lower chest wall. Noted to have some splinting with pain despite Morphine . There is some reproducible TTP to the LUQ and overlying the left lower ribs. No crepitus.  Pelvic US  is negative for acute pathology. CT reassuring. I have viewed the images which show no lower pulmonary changes. Denies surgeries, OCP use, prolonged travel; works as a [employer] pl[employer]. Will check D dimer and lipase given persistent symptoms and location of pain. Overall low PE risk. Denies FHx of VTE. Toradol  ordered for pain.  9:45 AM Symptoms improved after Toradol . Dimer returned positive. Discussed with patient plan for CTA. IVF ordered given need to redose with IV contrast. VSS.  11:34 AM CTA negative for PE.  There does appear to be low-grade inflammatory stranding in the left paracolic tissue.  This may be related to descending colon inflammation.  In the setting of reported diarrhea, this is clinically consistent with colitis.  May be foodborne versus viral.  Do not feel course of antibiotics is currently indicated given lack of fever, leukocytosis.  No SIRS criteria.  Will proceed with outpatient symptomatic management.   Vitals:   03/20/24 1000 03/20/24 1015 03/20/24 1030 03/20/24 1045  BP: (!) 126/115 100/64    Pulse: 69 64 63 69  Resp:      Temp:      SpO2: 100% 100% 100% 100%   Results for orders placed or performed during the hospital encounter of 03/20/24  Comprehensive metabolic panel   Collection Time: 03/20/24  4:41 AM  Result Value Ref Range   Sodium 136 135 - 145 mmol/L   Potassium 4.1 3.5 - 5.1 mmol/L   Chloride 105 98 - 111 mmol/L   CO2 22 22 - 32 mmol/L   Glucose, Bld 107 (H) 70 - 99 mg/dL   BUN 8 6 - 20 mg/dL   Creatinine, Ser 9.19 0.44 - 1.00 mg/dL   Calcium  9.1 8.9 - 10.3 mg/dL   Total Protein 7.3 6.5 - 8.1 g/dL   Albumin 3.5 3.5 - 5.0 g/dL    AST 19 15 - 41 U/L   ALT 17 0 - 44 U/L   Alkaline Phosphatase 55 38 - 126 U/L   Total Bilirubin 0.4 0.0 - 1.2 mg/dL   GFR, Estimated >39 >39 mL/min   Anion gap 9 5 - 15  Urinalysis, Routine w reflex microscopic -Urine, Clean Catch   Collection Time: 03/20/24  4:41 AM  Result Value Ref Range   Color, Urine YELLOW YELLOW   APPearance CLEAR CLEAR   Specific Gravity, Urine 1.026 1.005 - 1.030   pH 5.0 5.0 - 8.0   Glucose, UA NEGATIVE NEGATIVE mg/dL   Hgb urine dipstick NEGATIVE NEGATIVE   Bilirubin Urine NEGATIVE NEGATIVE   Ketones, ur NEGATIVE NEGATIVE mg/dL   Protein, ur NEGATIVE NEGATIVE mg/dL   Nitrite NEGATIVE NEGATIVE   Leukocytes,Ua NEGATIVE NEGATIVE  CBC with Differential   Collection Time: 03/20/24  4:41 AM  Result Value Ref Range   WBC 7.6 4.0 - 10.5 K/uL   RBC 4.34 3.87 - 5.11 MIL/uL   Hemoglobin 11.1 (L) 12.0 - 15.0 g/dL   HCT 65.7 (L) 63.9 - 53.9 %   MCV 78.8 (L) 80.0 - 100.0 fL   MCH 25.6 (L) 26.0 - 34.0 pg   MCHC 32.5 30.0 - 36.0 g/dL   RDW 85.2 88.4 - 84.4 %  Platelets 377 150 - 400 K/uL   nRBC 0.0 0.0 - 0.2 %   Neutrophils Relative % 56 %   Neutro Abs 4.2 1.7 - 7.7 K/uL   Lymphocytes Relative 33 %   Lymphs Abs 2.5 0.7 - 4.0 K/uL   Monocytes Relative 8 %   Monocytes Absolute 0.6 0.1 - 1.0 K/uL   Eosinophils Relative 3 %   Eosinophils Absolute 0.2 0.0 - 0.5 K/uL   Basophils Relative 0 %   Basophils Absolute 0.0 0.0 - 0.1 K/uL   Immature Granulocytes 0 %   Abs Immature Granulocytes 0.01 0.00 - 0.07 K/uL  hCG, serum, qualitative   Collection Time: 03/20/24  4:41 AM  Result Value Ref Range   Preg, Serum NEGATIVE NEGATIVE  D-dimer, quantitative   Collection Time: 03/20/24  8:35 AM  Result Value Ref Range   D-Dimer, Quant 0.53 (H) 0.00 - 0.50 ug/mL-FEU  Lipase, blood   Collection Time: 03/20/24  8:35 AM  Result Value Ref Range   Lipase 35 11 - 51 U/L   CT Angio Chest PE W and/or Wo Contrast Result Date: 03/20/2024 CLINICAL DATA:  Left abdominal  pain, elevated D-dimer level EXAM: CT ANGIOGRAPHY CHEST WITH CONTRAST TECHNIQUE: Multidetector CT imaging of the chest was performed using the standard protocol during bolus administration of intravenous contrast. Multiplanar CT image reconstructions and MIPs were obtained to evaluate the vascular anatomy. RADIATION DOSE REDUCTION: This exam was performed according to the departmental dose-optimization program which includes automated exposure control, adjustment of the mA and/or kV according to patient size and/or use of iterative reconstruction technique. CONTRAST:  75mL OMNIPAQUE  IOHEXOL  350 MG/ML SOLN COMPARISON:  CT abdomen 03/20/2024 FINDINGS: Cardiovascular: No filling defect is identified in the pulmonary arterial tree to suggest pulmonary embolus. Mediastinum/Nodes: Unremarkable Lungs/Pleura: Unremarkable Upper Abdomen: Low-grade inflammatory stranding in the left paracolic adipose tissues just below the accessory spleen into the left of the splenic flexure with mild thickening of the adjacent left paracolic gutter. The appearance is abnormal but nonspecific and could be related to prior trauma or possibly descending colon inflammation. No overlying rib deformity or definite splenic laceration identified. Musculoskeletal: Unremarkable Review of the MIP images confirms the above findings. IMPRESSION: 1. No filling defect is identified in the pulmonary arterial tree to suggest pulmonary embolus. 2. Low-grade inflammatory stranding in the left paracolic adipose tissues just below the accessory spleen into the left of the splenic flexure with mild thickening of the adjacent left paracolic gutter. The appearance is abnormal but nonspecific and could be related to prior trauma or possibly descending colon inflammation. No overlying rib deformity or definite splenic laceration identified. Electronically Signed   By: Ryan Salvage M.D.   On: 03/20/2024 11:26   US  PELVIC COMPLETE W TRANSVAGINAL AND TORSION  R/O Result Date: 03/20/2024 CLINICAL DATA:  28 year old female with left lower quadrant pain, abdomen and pelvic pain. Unrevealing CT Abdomen and Pelvis. EXAM: TRANSABDOMINAL AND TRANSVAGINAL ULTRASOUND OF PELVIS DOPPLER ULTRASOUND OF OVARIES TECHNIQUE: Both transabdominal and transvaginal ultrasound examinations of the pelvis were performed. Transabdominal technique was performed for global imaging of the pelvis including uterus, ovaries, adnexal regions, and pelvic cul-de-sac. It was necessary to proceed with endovaginal exam following the transabdominal exam to visualize the ovaries. Color and duplex Doppler ultrasound was utilized to evaluate blood flow to the ovaries. COMPARISON:  CT Abdomen and Pelvis 0554 hours today. FINDINGS: Uterus Measurements: 7.8 x 4.3 x 5.1 cm = volume: 88 mL. No fibroids or other mass visualized. Endometrium Thickness: 13  mm.  No focal abnormality visualized. Right ovary Measurements: 2.5 x 1.4 x 2.4 cm = volume: 5 mL. Normal appearance/no adnexal mass. Left ovary Measurements: 2.5 x 0.9 x 1.2 cm = volume: 1-2 mL. Normal appearance/no adnexal mass. Pulsed Doppler evaluation of both ovaries demonstrates normal low-resistance arterial and venous waveforms. Other findings No abnormal free fluid. IMPRESSION: Negative for ovarian torsion. Normal Ultrasound appearance of the female pelvis with Doppler. Electronically Signed   By: VEAR Hurst M.D.   On: 03/20/2024 07:37   CT ABDOMEN PELVIS W CONTRAST Result Date: 03/20/2024 CLINICAL DATA:  28 year old female with abdominal pain. Left lower quadrant pain. EXAM: CT ABDOMEN AND PELVIS WITH CONTRAST TECHNIQUE: Multidetector CT imaging of the abdomen and pelvis was performed using the standard protocol following bolus administration of intravenous contrast. RADIATION DOSE REDUCTION: This exam was performed according to the departmental dose-optimization program which includes automated exposure control, adjustment of the mA and/or kV according to  patient size and/or use of iterative reconstruction technique. CONTRAST:  75mL OMNIPAQUE  IOHEXOL  350 MG/ML SOLN COMPARISON:  None Available. FINDINGS: Lower chest: Normal. Hepatobiliary: Negative liver and gallbladder; Phrygian cap, normal variant. Pancreas: Negative. Spleen: Negative; splenule, normal variant. Adrenals/Urinary Tract: Normal adrenal glands. Symmetric and nonobstructed kidneys. No nephrolithiasis or evidence of renal inflammation. Unremarkable ureters. Diminutive bladder. Pelvic phleboliths more numerous on the left. Stomach/Bowel: Mild large bowel retained gas and stool throughout. No large bowel wall thickening identified. Normal appendix containing gas on coronal image 53. Nondilated terminal ileum. Nondilated small bowel loops otherwise. Decompressed stomach and duodenum. No pneumoperitoneum, free fluid in the abdomen, mesenteric inflammation identified. Vascular/Lymphatic: Major arterial structures, portal venous system appear patent and normal. No lymphadenopathy identified. Reproductive: Within normal limits. Other: Trace free fluid in the cul-de-sac, likely physiologic on series 3, image 65. Musculoskeletal: Normal. IMPRESSION: 1. Negative CT Abdomen. Normal appendix. 2. Trace free fluid in the pelvis appears physiologic. Electronically Signed   By: VEAR Hurst M.D.   On: 03/20/2024 06:14      Keith Sor, PA-C 03/20/24 1243    Simon Lavonia SAILOR, MD 03/21/24 [id number]

## 2024-03-20 NOTE — ED Provider Notes (Signed)
 [facility] Provider Note   CSN: [id number] Arrival date & time: 03/20/24  9575     Patient presents with: Abdominal Pain   G[facility] Pearson is a 28 y.o. female patient with past medical history significant for psychogenic nonepileptic seizure, borderline personality disorder, recurrent urinary tract infections, anal fissure, anemia presents to the emergency room complaining of diarrhea and left lower quadrant/flank pain which began on Tuesday evening.  Patient also complains of nausea without emesis.  She denies fever, chest pain, shortness of breath, urinary symptoms, vaginal discharge.  She denies any blood in her stool.    Abdominal Pain      Prior to Admission medications   Medication Sig Start Date End Date Taking? Authorizing Provider  budesonide (RHINOCORT AQUA) 32 MCG/ACT nasal spray Place 2 sprays into both nostrils daily. Patient not taking: Reported on 03/05/2022 01/03/22   [provider]  butalbital -acetaminophen -caffeine  (FIORICET) 50-325-40 MG tablet Take 1 tablet every 6 hours for migraine headache not to exceed 5 days a month. Patient not taking: Reported on 04/30/2022 10/19/21   Cris Burnard DEL, MD  cetirizine (ZYRTEC) 10 MG tablet Take 10 mg by mouth daily. Patient not taking: Reported on 04/30/2022 01/03/22   [provider]  ferrous sulfate  325 (65 FE) MG EC tablet Take 1 tablet (325 mg total) by mouth every other day. Patient not taking: Reported on 04/30/2022 03/29/22 09/25/22  Ndulue, Chiagoziem J, MD  FIBER ADULT GUMMIES PO Take by mouth. Patient not taking: Reported on 04/30/2022    [provider]  ibuprofen  (ADVIL ) 600 MG tablet Take 1 tablet (600 mg total) by mouth every 6 (six) hours as needed for moderate pain. Patient not taking: Reported on 04/30/2022 03/29/22   Ndulue, Chiagoziem J, MD  oxyCODONE  (OXY IR/ROXICODONE ) 5 MG immediate release tablet Take 1-2 tablets (5-10 mg total) by mouth  every 4 (four) hours as needed for moderate pain. Patient not taking: Reported on 04/30/2022 03/29/22   Ndulue, Chiagoziem J, MD  polyethylene glycol powder (GLYCOLAX /MIRALAX ) 17 GM/SCOOP powder Take 17 g by mouth daily as needed. Patient not taking: Reported on 04/30/2022 03/29/22   Ndulue, Chiagoziem J, MD    Allergies: Azithromycin, Ceftriaxone, Beef-derived drug products, Fish-derived products, Other, Pork-derived products, and Sumatriptan    Review of Systems  Gastrointestinal:  Positive for abdominal pain.    Updated Vital Signs BP (!) 134/90 (BP Location: Right Arm)   Pulse 93   Temp 97.8 F (36.6 C)   Resp 20   LMP 01/20/2024   SpO2 96%   Physical Exam Vitals and nursing note reviewed.  Constitutional:      General: She is not in acute distress.    Appearance: She is well-developed.  HENT:     Head: Normocephalic and atraumatic.  Eyes:     Conjunctiva/sclera: Conjunctivae normal.  Cardiovascular:     Rate and Rhythm: Normal rate and regular rhythm.     Heart sounds: No murmur heard. Pulmonary:     Effort: Pulmonary effort is normal. No respiratory distress.     Breath sounds: Normal breath sounds.  Abdominal:     Palpations: Abdomen is soft.     Tenderness: There is abdominal tenderness in the left upper quadrant and left lower quadrant.     Comments: Left-sided flank and abdominal pain, difficult to pinpoint  Musculoskeletal:        General: No swelling.     Cervical back: Neck supple.  Skin:  General: Skin is warm and dry.     Capillary Refill: Capillary refill takes less than 2 seconds.  Neurological:     Mental Status: She is alert.  Psychiatric:        Mood and Affect: Mood normal.     (all labs ordered are listed, but only abnormal results are displayed) Labs Reviewed  COMPREHENSIVE METABOLIC PANEL WITH GFR - Abnormal; Notable for the following components:      Result Value   Glucose, Bld 107 (*)    All other components within normal limits  CBC  WITH DIFFERENTIAL/PLATELET - Abnormal; Notable for the following components:   Hemoglobin 11.1 (*)    HCT 34.2 (*)    MCV 78.8 (*)    MCH 25.6 (*)    All other components within normal limits  URINALYSIS, ROUTINE W REFLEX MICROSCOPIC  HCG, SERUM, QUALITATIVE    EKG: None  Radiology: CT ABDOMEN PELVIS W CONTRAST Result Date: 03/20/2024 CLINICAL DATA:  28 year old female with abdominal pain. Left lower quadrant pain. EXAM: CT ABDOMEN AND PELVIS WITH CONTRAST TECHNIQUE: Multidetector CT imaging of the abdomen and pelvis was performed using the standard protocol following bolus administration of intravenous contrast. RADIATION DOSE REDUCTION: This exam was performed according to the departmental dose-optimization program which includes automated exposure control, adjustment of the mA and/or kV according to patient size and/or use of iterative reconstruction technique. CONTRAST:  75mL OMNIPAQUE  IOHEXOL  350 MG/ML SOLN COMPARISON:  None Available. FINDINGS: Lower chest: Normal. Hepatobiliary: Negative liver and gallbladder; Phrygian cap, normal variant. Pancreas: Negative. Spleen: Negative; splenule, normal variant. Adrenals/Urinary Tract: Normal adrenal glands. Symmetric and nonobstructed kidneys. No nephrolithiasis or evidence of renal inflammation. Unremarkable ureters. Diminutive bladder. Pelvic phleboliths more numerous on the left. Stomach/Bowel: Mild large bowel retained gas and stool throughout. No large bowel wall thickening identified. Normal appendix containing gas on coronal image 53. Nondilated terminal ileum. Nondilated small bowel loops otherwise. Decompressed stomach and duodenum. No pneumoperitoneum, free fluid in the abdomen, mesenteric inflammation identified. Vascular/Lymphatic: Major arterial structures, portal venous system appear patent and normal. No lymphadenopathy identified. Reproductive: Within normal limits. Other: Trace free fluid in the cul-de-sac, likely physiologic on series  3, image 65. Musculoskeletal: Normal. IMPRESSION: 1. Negative CT Abdomen. Normal appendix. 2. Trace free fluid in the pelvis appears physiologic. Electronically Signed   By: VEAR Hurst M.D.   On: 03/20/2024 06:14     Procedures   Medications Ordered in the ED  morphine  (PF) 4 MG/ML injection 4 mg (4 mg Intravenous Given 03/20/24 0525)  ondansetron  (ZOFRAN ) injection 4 mg (4 mg Intravenous Given 03/20/24 0522)  iohexol  (OMNIPAQUE ) 350 MG/ML injection 75 mL (75 mLs Intravenous Contrast Given 03/20/24 0557)                                    Medical Decision Making Amount and/or Complexity of Data Reviewed Labs: ordered. Radiology: ordered.  Risk Prescription drug management.   This patient presents to the ED for concern of abdominal pain, this involves an extensive number of treatment options, and is a complaint that carries with it a high risk of complications and morbidity.  The differential diagnosis includes diverticulitis, colitis, pyelonephritis, nephrolithiasis, musculoskeletal pain, others   Co morbidities / Chronic conditions that complicate the patient evaluation  History of anal fissures, anemia   Additional history obtained:  Additional history obtained from EMR   Lab Tests:  I Ordered, and personally  interpreted labs.  The pertinent results include: Hemoglobin 11.1, consistent with labs from 1 month ago, unremarkable CMP, unremarkable UA, negative pregnancy test   Imaging Studies ordered:  I ordered imaging studies including CT abdomen pelvis with contrast I independently visualized and interpreted imaging which showed no acute findings in the pelvis or abdomen to explain patient's pain. I agree with the radiologist interpretation   Cardiac Monitoring: / EKG:  The patient was maintained on a cardiac monitor.  I personally viewed and interpreted the cardiac monitored which showed an underlying rhythm of: Sinus rhythm with arrhythmia   Problem List / ED Course /  Critical interventions / Medication management   I ordered medication including morphine , Zofran  Reevaluation of the patient after these medicines showed that the patient improved I have reviewed the patients home medicines and have made adjustments as needed   Social Determinants of Health:  Patient gets the majority of her care through [facility]   Test / Admission - Considered:  Patient continued to having left lower quadrant abdominal pain, hard to pinpoint but tender along the left lower specifically but also along the left upper quadrants.  CT scan grossly unremarkable.  I do feel that a pelvic ultrasound is reasonable to rule out torsion.  Ultrasound of the pelvis has been ordered.  Patient care being transferred to [facility], PA-C at shift handoff.  Disposition pending results of imaging and reassessment       Final diagnoses:  None    ED Discharge Orders     None          Logan Ubaldo KATHEE DEVONNA 03/20/24 [id number]    Lorette Mayo, MD 03/23/24 [phone]

## 2024-03-20 NOTE — ED Triage Notes (Signed)
 Pt reporting left lower quadrant/flank pain starting several days ago. Flexing her abdominal muscles helps the pain as well as splinting. Anything else makes it worse. Diarrhea since Tuesday. Denies fevers.

## 2024-03-21 ENCOUNTER — Emergency Department (HOSPITAL_BASED_OUTPATIENT_CLINIC_OR_DEPARTMENT_OTHER)
Admission: EM | Admit: 2024-03-21 | Discharge: 2024-03-21 | Disposition: A | Discharge status: Home / Self Care | Attending: Emergency Medicine | Admitting: Emergency Medicine

## 2024-03-21 ENCOUNTER — Other Ambulatory Visit: Payer: Self-pay

## 2024-03-21 DIAGNOSIS — K6389 Other specified diseases of intestine: Secondary | ICD-10-CM | POA: Insufficient documentation

## 2024-03-21 DIAGNOSIS — R1012 Left upper quadrant pain: Secondary | ICD-10-CM | POA: Diagnosis present

## 2024-03-21 LAB — CBC WITH DIFFERENTIAL/PLATELET
Abs Immature Granulocytes: 0.01 K/uL (ref 0.00–0.07)
Basophils Absolute: 0 K/uL (ref 0.0–0.1)
Basophils Relative: 1 %
Eosinophils Absolute: 0.2 K/uL (ref 0.0–0.5)
Eosinophils Relative: 3 %
HCT: 32.5 % — ABNORMAL LOW (ref 36.0–46.0)
Hemoglobin: 10.6 g/dL — ABNORMAL LOW (ref 12.0–15.0)
Immature Granulocytes: 0 %
Lymphocytes Relative: 26 %
Lymphs Abs: 1.4 K/uL (ref 0.7–4.0)
MCH: 25.7 pg — ABNORMAL LOW (ref 26.0–34.0)
MCHC: 32.6 g/dL (ref 30.0–36.0)
MCV: 78.7 fL — ABNORMAL LOW (ref 80.0–100.0)
Monocytes Absolute: 0.5 K/uL (ref 0.1–1.0)
Monocytes Relative: 9 %
Neutro Abs: 3.4 K/uL (ref 1.7–7.7)
Neutrophils Relative %: 61 %
Platelets: 336 K/uL (ref 150–400)
RBC: 4.13 MIL/uL (ref 3.87–5.11)
RDW: 14.9 % (ref 11.5–15.5)
WBC: 5.6 K/uL (ref 4.0–10.5)
nRBC: 0 % (ref 0.0–0.2)

## 2024-03-21 LAB — COMPREHENSIVE METABOLIC PANEL WITH GFR
ALT: 25 U/L (ref 0–44)
AST: 27 U/L (ref 15–41)
Albumin: 4 g/dL (ref 3.5–5.0)
Alkaline Phosphatase: 57 U/L (ref 38–126)
Anion gap: 11 (ref 5–15)
BUN: 6 mg/dL (ref 6–20)
CO2: 24 mmol/L (ref 22–32)
Calcium: 9.2 mg/dL (ref 8.9–10.3)
Chloride: 103 mmol/L (ref 98–111)
Creatinine, Ser: 0.88 mg/dL (ref 0.44–1.00)
GFR, Estimated: >60 mL/min (ref >60)
Glucose, Bld: 90 mg/dL (ref 70–99)
Potassium: 4.3 mmol/L (ref 3.5–5.1)
Sodium: 138 mmol/L (ref 135–145)
Total Bilirubin: 0.4 mg/dL (ref 0.0–1.2)
Total Protein: 7.4 g/dL (ref 6.5–8.1)

## 2024-03-21 LAB — LIPASE, BLOOD: Lipase: 32 U/L (ref 11–51)

## 2024-03-21 MED ORDER — ONDANSETRON HCL 4 MG/2ML IJ SOLN
4.0000 mg | Freq: Once | INTRAMUSCULAR | Status: AC
  Administered 2024-03-21: 4 mg via INTRAVENOUS
  Filled 2024-03-21: qty 2

## 2024-03-21 MED ORDER — ONDANSETRON HCL 4 MG PO TABS: 4.0000 mg | ORAL_TABLET | Freq: Three times a day (TID) | ORAL | 0 refills | Status: AC | PRN

## 2024-03-21 MED ORDER — HYDROMORPHONE HCL 1 MG/ML IJ SOLN
0.5000 mg | Freq: Once | INTRAMUSCULAR | Status: AC
  Administered 2024-03-21: 0.5 mg via INTRAVENOUS
  Filled 2024-03-21: qty 1

## 2024-03-21 MED ORDER — OXYCODONE HCL 5 MG PO TABS: 2.5000 mg | ORAL_TABLET | ORAL | 0 refills | Status: AC | PRN

## 2024-03-21 MED ORDER — CELECOXIB 200 MG PO CAPS: 200.0000 mg | ORAL_CAPSULE | Freq: Two times a day (BID) | ORAL | 0 refills | Status: AC

## 2024-03-21 MED ORDER — FAMOTIDINE IN NACL 20-0.9 MG/50ML-% IV SOLN
20.0000 mg | Freq: Once | INTRAVENOUS | Status: AC
  Administered 2024-03-21: 20 mg via INTRAVENOUS
  Filled 2024-03-21: qty 50

## 2024-03-21 MED ORDER — SODIUM CHLORIDE 0.9 % IV BOLUS
1000.0000 mL | Freq: Once | INTRAVENOUS | Status: AC
  Administered 2024-03-21: 1000 mL via INTRAVENOUS

## 2024-03-21 MED ORDER — KETOROLAC TROMETHAMINE 30 MG/ML IJ SOLN
30.0000 mg | Freq: Once | INTRAMUSCULAR | Status: AC
  Administered 2024-03-21: 30 mg via INTRAVENOUS
  Filled 2024-03-21: qty 1

## 2024-03-21 MED ORDER — PANTOPRAZOLE SODIUM 40 MG PO TBEC: 40.0000 mg | DELAYED_RELEASE_TABLET | Freq: Every day | ORAL | 0 refills | Status: AC

## 2024-03-21 NOTE — Discharge Instructions (Addendum)
 As discussed, I am changing your medication regimen. Please take Protonix  daily for acid control. I have given you a referral to [facility] gastroenterology.  Please call to make an appointment starting Monday morning.  Please read all of the attached information especially the part on reasons to seek immediate medical care which would include severe or worsening pain, intractable vomiting, fever, severe weakness lethargy or confusion.  If you continue to have pain in the next 4 weeks you may need reevaluation with a repeat image.  Hopefully this will resolve by that time.  You may take Tylenol  along with the medications that I prescribed for pain.  Do not take other over-the-counter pain medications like naproxen  (Aleve ) or ibuprofen  (Advil ).  Get help right away for any new or worsening symptoms.

## 2024-03-21 NOTE — ED Triage Notes (Signed)
 Patient states dx with colitis yesterday at [facility]. States pain and nausea worse today

## 2024-03-21 NOTE — ED Provider Notes (Signed)
 [place] EMERGENCY DEPARTMENT AT [facility] Provider Note   CSN: [id number] Arrival date & time: 03/21/24  1142     Patient presents with: Emesis   Gina Pearson is a 28 y.o. female who presents emergency department with chief complaint of left upper quadrant and left side pain.  She was evaluated in the emergency department yesterday with extensive workup including CT abdomen pelvis which was negative, pelvic ultrasound which was also negative, and CT angiogram PE study which was negative.  For PE but showed an inflammatory fat stranding process posterior to the splenic flexure.  Patient was ultimately diagnosed with colitis.  Today she complains that her pain is unrelenting.  It is worse with deep breathing movement coughing riding in a car and going over bumps.  She states that she is extremely nauseous and every time she tries to eat her pain is worsened and she vomits.  She denies any diarrhea fevers or chills.  She has never had anything like this before.    Emesis      Prior to Admission medications   Medication Sig Start Date End Date Taking? Authorizing Provider  budesonide (RHINOCORT AQUA) 32 MCG/ACT nasal spray Place 2 sprays into both nostrils daily. Patient not taking: Reported on 03/05/2022 01/03/22   [provider]  butalbital -acetaminophen -caffeine  (FIORICET) 50-325-40 MG tablet Take 1 tablet every 6 hours for migraine headache not to exceed 5 days a month. Patient not taking: Reported on 04/30/2022 10/19/21   Cris Burnard DEL, MD  cetirizine (ZYRTEC) 10 MG tablet Take 10 mg by mouth daily. Patient not taking: Reported on 04/30/2022 01/03/22   [provider]  dicyclomine  (BENTYL ) 20 MG tablet Take 1 tablet (20 mg total) by mouth every 12 (twelve) hours as needed (for abdominal pain/cramping). 03/20/24   Keith Burnard, PA-C  ferrous sulfate  325 (65 FE) MG EC tablet Take 1 tablet (325 mg total) by mouth every other day. Patient not taking: Reported  on 04/30/2022 03/29/22 09/25/22  Ndulue, Chiagoziem J, MD  FIBER ADULT GUMMIES PO Take by mouth. Patient not taking: Reported on 04/30/2022    [provider]  ibuprofen  (ADVIL ) 600 MG tablet Take 1 tablet (600 mg total) by mouth every 6 (six) hours as needed for moderate pain. Patient not taking: Reported on 04/30/2022 03/29/22   Ndulue, Chiagoziem J, MD  naproxen  (NAPROSYN ) 500 MG tablet Take 1 tablet (500 mg total) by mouth 2 (two) times daily with a meal. Take as prescribed for abdominal pain. 03/20/24   Keith Burnard, PA-C  oxyCODONE  (OXY IR/ROXICODONE ) 5 MG immediate release tablet Take 1-2 tablets (5-10 mg total) by mouth every 4 (four) hours as needed for moderate pain. Patient not taking: Reported on 04/30/2022 03/29/22   Ndulue, Chiagoziem J, MD  polyethylene glycol powder (GLYCOLAX /MIRALAX ) 17 GM/SCOOP powder Take 17 g by mouth daily as needed. Patient not taking: Reported on 04/30/2022 03/29/22   Ndulue, Chiagoziem J, MD    Allergies: Azithromycin, Ceftriaxone, Beef-derived drug products, Fish-derived products, Other, Pork-derived products, and Sumatriptan    Review of Systems  Gastrointestinal:  Positive for vomiting.    Updated Vital Signs BP 139/86   Pulse 77   Temp 98.2 F (36.8 C) (Oral)   Resp 20   LMP 01/20/2024   SpO2 98%   Physical Exam Vitals and nursing note reviewed.  Constitutional:      General: She is not in acute distress.    Appearance: She is well-developed. She is not diaphoretic.  HENT:  Head: Normocephalic and atraumatic.     Right Ear: External ear normal.     Left Ear: External ear normal.     Nose: Nose normal.     Mouth/Throat:     Mouth: Mucous membranes are moist.  Eyes:     General: No scleral icterus.    Conjunctiva/sclera: Conjunctivae normal.  Cardiovascular:     Rate and Rhythm: Normal rate and regular rhythm.     Heart sounds: Normal heart sounds. No murmur heard.    No friction rub. No gallop.  Pulmonary:     Effort: Pulmonary  effort is normal. No respiratory distress.     Breath sounds: Normal breath sounds.  Abdominal:     General: Bowel sounds are normal. There is no distension.     Palpations: Abdomen is soft. There is no mass.     Tenderness: There is no abdominal tenderness. There is guarding. There is no rebound.   Musculoskeletal:     Cervical back: Normal range of motion.  Skin:    General: Skin is warm and dry.  Neurological:     Mental Status: She is alert and oriented to person, place, and time.  Psychiatric:        Behavior: Behavior normal.     (all labs ordered are listed, but only abnormal results are displayed) Labs Reviewed  COMPREHENSIVE METABOLIC PANEL WITH GFR  CBC WITH DIFFERENTIAL/PLATELET  LIPASE, BLOOD    EKG: None  Radiology: CT Angio Chest PE W and/or Wo Contrast Result Date: 03/20/2024 CLINICAL DATA:  Left abdominal pain, elevated D-dimer level EXAM: CT ANGIOGRAPHY CHEST WITH CONTRAST TECHNIQUE: Multidetector CT imaging of the chest was performed using the standard protocol during bolus administration of intravenous contrast. Multiplanar CT image reconstructions and MIPs were obtained to evaluate the vascular anatomy. RADIATION DOSE REDUCTION: This exam was performed according to the departmental dose-optimization program which includes automated exposure control, adjustment of the mA and/or kV according to patient size and/or use of iterative reconstruction technique. CONTRAST:  75mL OMNIPAQUE  IOHEXOL  350 MG/ML SOLN COMPARISON:  CT abdomen 03/20/2024 FINDINGS: Cardiovascular: No filling defect is identified in the pulmonary arterial tree to suggest pulmonary embolus. Mediastinum/Nodes: Unremarkable Lungs/Pleura: Unremarkable Upper Abdomen: Low-grade inflammatory stranding in the left paracolic adipose tissues just below the accessory spleen into the left of the splenic flexure with mild thickening of the adjacent left paracolic gutter. The appearance is abnormal but nonspecific and  could be related to prior trauma or possibly descending colon inflammation. No overlying rib deformity or definite splenic laceration identified. Musculoskeletal: Unremarkable Review of the MIP images confirms the above findings. IMPRESSION: 1. No filling defect is identified in the pulmonary arterial tree to suggest pulmonary embolus. 2. Low-grade inflammatory stranding in the left paracolic adipose tissues just below the accessory spleen into the left of the splenic flexure with mild thickening of the adjacent left paracolic gutter. The appearance is abnormal but nonspecific and could be related to prior trauma or possibly descending colon inflammation. No overlying rib deformity or definite splenic laceration identified. Electronically Signed   By: Ryan Salvage M.D.   On: 03/20/2024 11:26   US  PELVIC COMPLETE W TRANSVAGINAL AND TORSION R/O Result Date: 03/20/2024 CLINICAL DATA:  28 year old female with left lower quadrant pain, abdomen and pelvic pain. Unrevealing CT Abdomen and Pelvis. EXAM: TRANSABDOMINAL AND TRANSVAGINAL ULTRASOUND OF PELVIS DOPPLER ULTRASOUND OF OVARIES TECHNIQUE: Both transabdominal and transvaginal ultrasound examinations of the pelvis were performed. Transabdominal technique was performed for global imaging of the  pelvis including uterus, ovaries, adnexal regions, and pelvic cul-de-sac. It was necessary to proceed with endovaginal exam following the transabdominal exam to visualize the ovaries. Color and duplex Doppler ultrasound was utilized to evaluate blood flow to the ovaries. COMPARISON:  CT Abdomen and Pelvis 0554 hours today. FINDINGS: Uterus Measurements: 7.8 x 4.3 x 5.1 cm = volume: 88 mL. No fibroids or other mass visualized. Endometrium Thickness: 13 mm.  No focal abnormality visualized. Right ovary Measurements: 2.5 x 1.4 x 2.4 cm = volume: 5 mL. Normal appearance/no adnexal mass. Left ovary Measurements: 2.5 x 0.9 x 1.2 cm = volume: 1-2 mL. Normal appearance/no adnexal  mass. Pulsed Doppler evaluation of both ovaries demonstrates normal low-resistance arterial and venous waveforms. Other findings No abnormal free fluid. IMPRESSION: Negative for ovarian torsion. Normal Ultrasound appearance of the female pelvis with Doppler. Electronically Signed   By: VEAR Hurst M.D.   On: 03/20/2024 07:37   CT ABDOMEN PELVIS W CONTRAST Result Date: 03/20/2024 CLINICAL DATA:  28 year old female with abdominal pain. Left lower quadrant pain. EXAM: CT ABDOMEN AND PELVIS WITH CONTRAST TECHNIQUE: Multidetector CT imaging of the abdomen and pelvis was performed using the standard protocol following bolus administration of intravenous contrast. RADIATION DOSE REDUCTION: This exam was performed according to the departmental dose-optimization program which includes automated exposure control, adjustment of the mA and/or kV according to patient size and/or use of iterative reconstruction technique. CONTRAST:  75mL OMNIPAQUE  IOHEXOL  350 MG/ML SOLN COMPARISON:  None Available. FINDINGS: Lower chest: Normal. Hepatobiliary: Negative liver and gallbladder; Phrygian cap, normal variant. Pancreas: Negative. Spleen: Negative; splenule, normal variant. Adrenals/Urinary Tract: Normal adrenal glands. Symmetric and nonobstructed kidneys. No nephrolithiasis or evidence of renal inflammation. Unremarkable ureters. Diminutive bladder. Pelvic phleboliths more numerous on the left. Stomach/Bowel: Mild large bowel retained gas and stool throughout. No large bowel wall thickening identified. Normal appendix containing gas on coronal image 53. Nondilated terminal ileum. Nondilated small bowel loops otherwise. Decompressed stomach and duodenum. No pneumoperitoneum, free fluid in the abdomen, mesenteric inflammation identified. Vascular/Lymphatic: Major arterial structures, portal venous system appear patent and normal. No lymphadenopathy identified. Reproductive: Within normal limits. Other: Trace free fluid in the cul-de-sac,  likely physiologic on series 3, image 65. Musculoskeletal: Normal. IMPRESSION: 1. Negative CT Abdomen. Normal appendix. 2. Trace free fluid in the pelvis appears physiologic. Electronically Signed   By: VEAR Hurst M.D.   On: 03/20/2024 06:14     Procedures   Medications Ordered in the ED  sodium chloride  0.9 % bolus 1,000 mL (has no administration in time range)  HYDROmorphone  (DILAUDID ) injection 0.5 mg (has no administration in time range)  ondansetron  (ZOFRAN ) injection 4 mg (has no administration in time range)  ketorolac  (TORADOL ) 30 MG/ML injection 30 mg (has no administration in time range)  famotidine  (PEPCID ) IVPB 20 mg premix (has no administration in time range)    Clinical Course as of 03/21/24 1226  Sat Mar 21, 2024  1221 Case discussed with Dr. Kimberlee of Radiology. We reviewed finding from yesterday.  He does not see any evidence of localized inflammation of the colon but does agree with inflammatory stranding noted on CT angiogram PE study yesterday near the splenic flexure and proximal to the spleen on the left side.  Given the findings you know epiploic appendagitis could potentially be a cause of this however findings are not specifically classic for epiploic appendagitis.  There is some inflammatory process of the mesentery there as well.  [AH]    Clinical Course User Index [  AH] Arloa Chroman, PA-C                                 Medical Decision Making Amount and/or Complexity of Data Reviewed Labs: ordered.  Risk Prescription drug management.    This patient presents to the ED for concern of LUQ pain , this involves an extensive number of treatment options, and is a complaint that carries with it a high risk of complications and morbidity.  Current differential diagnosis includes epiploic appendagitis, mesenteric adenitis, pancreatitis.  Low suspicion for splenic infarct.  Current plan is to treat symptomatically and reevaluate labs.  At this time I would like  to avoid radiation to the patient if possible as she underwent 2 CT studies yesterday.  Co morbidities:  Hiatal hernia  Social Determinants of Health:   SDOH Screenings   Social Connections: Unknown (01/02/2022)   Received from [facility]  Tobacco Use: Low Risk  (03/20/2024)     Additional history:  Companion at bedside {External records from outside source obtained and reviewed including previous [place] notes  Lab Tests:  I Ordered, and personally interpreted labs.  The pertinent results include:   Mild microcytic anemia with hemoglobin of 10.6 No elevated lipase, no elevated white blood cell count, normal CMP  Imaging Studies:   Cardiac Monitoring/ECG:   Medicines ordered and prescription drug management:  I ordered medication including  Medications  sodium chloride  0.9 % bolus 1,000 mL (has no administration in time range)  HYDROmorphone  (DILAUDID ) injection 0.5 mg (has no administration in time range)  ondansetron  (ZOFRAN ) injection 4 mg (has no administration in time range)  ketorolac  (TORADOL ) 30 MG/ML injection 30 mg (has no administration in time range)  famotidine  (PEPCID ) IVPB 20 mg premix (has no administration in time range)   for pain control Reevaluation of the patient after these medicines showed that the patient resolved I have reviewed the patients home medicines and have made adjustments as needed  Test Considered:   considered repeat CT abdomen pelvis with contrast versus CT angiogram of the pelvis and abdomen for evaluation of inflammatory process in the left upper quadrant  Critical Interventions:    Consultations Obtained: As per ED course  Problem List / ED Course:     ICD-10-CM   1. 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 I suspect the patient has epiploic appendigitis.  Patient given information about this diagnosis.  She  is feeling much better.  Will switch her from naproxen  to Celebrex  as she does have a history of hiatal hernia and and some reflux issues.  Patient will also be discharged with Protonix , Zofran  and a small amount of oxycodone  for when her pain is more severe.  Discussed the course and expectations of symptoms that they can last for some time but most resolve on their own.  Have also discussed reasons to seek immediate medical care.  Will give the patient referral to GI specialist for outpatient follow-up.   Dispostion:  After consideration of the diagnostic results and the patients response to treatment, I feel that the patent would benefit from discharge with strict return precautions  PDMP reviewed during this encounter. .      Final diagnoses:  None    ED Discharge Orders     None  Arloa Chroman, PA-C 03/21/24 1529    Dasie Faden, MD 03/23/24 [phone]

## 2024-07-08 ENCOUNTER — Ambulatory Visit (INDEPENDENT_AMBULATORY_CARE_PROVIDER_SITE_OTHER): Payer: Self-pay | Admitting: Podiatry

## 2024-07-08 DIAGNOSIS — S93491A Sprain of other ligament of right ankle, initial encounter: Secondary | ICD-10-CM | POA: Diagnosis not present
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
## 2024-07-23 ENCOUNTER — Telehealth: Payer: Self-pay | Admitting: Lab

## 2024-07-23 NOTE — Telephone Encounter (Signed)
 Workman's comp calling in need of work note and any restrictions and office note states nothing was provided to patient and also patient is being billed and it should be a workman's comp claim [id number] please provide information.

## 2024-07-24 ENCOUNTER — Telehealth: Payer: Self-pay | Admitting: Podiatry

## 2024-07-24 NOTE — Telephone Encounter (Signed)
 per Dr Tobie can RTW 08/07/24-after next visit. I cld and lft mess on vmail to see if any restr/accom are needed when she RTW.

## 2024-07-28 ENCOUNTER — Telehealth: Payer: Self-pay | Admitting: Podiatry

## 2024-07-28 ENCOUNTER — Encounter: Payer: Self-pay | Admitting: Podiatry

## 2024-07-28 NOTE — Telephone Encounter (Signed)
 Pt lft mess that she will need desk duty only when she goes back to work 08/07/24. I will do letter with date and accomm

## 2024-07-29 ENCOUNTER — Telehealth: Payer: Self-pay | Admitting: Podiatry

## 2024-07-29 NOTE — Telephone Encounter (Signed)
 Patient reports continued pain and swelling despite wearing the boot. Patient is requesting to have an MRI performed. Please advise and notify the patient

## 2024-07-30 ENCOUNTER — Other Ambulatory Visit: Payer: Self-pay | Admitting: Podiatry

## 2024-07-30 DIAGNOSIS — S93491A Sprain of other ligament of right ankle, initial encounter: Secondary | ICD-10-CM

## 2024-08-05 ENCOUNTER — Ambulatory Visit: Payer: Self-pay | Admitting: Podiatry

## 2024-08-06 ENCOUNTER — Telehealth: Payer: Self-pay

## 2024-08-06 NOTE — Telephone Encounter (Signed)
 Melody with [employer] called wanting clarification on how patient was given a return to work note on 12/9 and had not been evaluated. I returned call to Melody and informed her  that patient was given note on 07/28/24 because originally she was not given a return to work note when she left her appointment on 07/08/24. She also stated that patient was given order for MRI and has been scheduled for MRI. Melody stated that they can not authorized an MRI without patient being evaluated. Worker comp clients are to be re-evaluated every 4 weeks. Patient has not been seen sinsce last office visit on 11/19 and cancelled 12/17 appointment. Until patient has been re-evaluated she will not be able to have MRI done. Just because patient called stating she is still having pain and swelling does not enable her to have MRI she must be evaluated.

## 2024-08-21 ENCOUNTER — Ambulatory Visit
Admission: RE | Admit: 2024-08-21 | Discharge: 2024-08-21 | Disposition: A | Discharge status: Home / Self Care | Payer: Worker's Compensation | Source: Ambulatory Visit | Attending: Podiatry | Admitting: Podiatry

## 2024-08-21 DIAGNOSIS — S93491A Sprain of other ligament of right ankle, initial encounter: Secondary | ICD-10-CM

## 2024-09-02 ENCOUNTER — Encounter: Payer: Self-pay | Admitting: Podiatry

## 2024-09-02 ENCOUNTER — Ambulatory Visit (INDEPENDENT_AMBULATORY_CARE_PROVIDER_SITE_OTHER): Payer: Self-pay | Admitting: Podiatry

## 2024-09-02 DIAGNOSIS — S93491A Sprain of other ligament of right ankle, initial encounter: Secondary | ICD-10-CM

## 2024-09-02 NOTE — Progress Notes (Signed)
 "  Subjective:  Patient ID: Gina Pearson, female    DOB: 14-Jan-1996,  MRN: [id number]  Chief Complaint  Patient presents with   Foot Pain    Pt stated that she is doing better but still has some discomfort     29 y.o. female presents with the above complaint.  Patient presents for follow-up of her right ankle sprain.  She states that she is doing a little bit better but still having pain.  She states that she has been wearing the boot for last 4 weeks.  Wanted to get it evaluated discuss next treatment plan she had an MRI done as well.  She is here to go over the results.   Review of Systems: Negative except as noted in the HPI. Denies N/V/F/Ch.  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 (Patient not taking: Reported on 03/05/2022), Disp: , Rfl:    butalbital -acetaminophen -caffeine  (FIORICET) 50-325-40 MG tablet, Take 1 tablet every 6 hours for migraine headache not to exceed 5 days a month. (Patient not taking: Reported on 04/30/2022), Disp: 20 tablet, Rfl: 0   celecoxib  (CELEBREX ) 200 MG capsule, Take 1 capsule (200 mg total) by mouth 2 (two) times daily., Disp: 20 capsule, Rfl: 0   cetirizine (ZYRTEC) 10 MG tablet, Take 10 mg by mouth daily. (Patient not taking: Reported on 04/30/2022), Disp: , Rfl:    dicyclomine  (BENTYL ) 20 MG tablet, Take 1 tablet (20 mg total) by mouth every 12 (twelve) hours as needed (for abdominal pain/cramping)., Disp: 20 tablet, Rfl: 0   ferrous sulfate  325 (65 FE) MG EC tablet, Take 1 tablet (325 mg total) by mouth every other day. (Patient not taking: Reported on  04/30/2022), Disp: 30 tablet, Rfl: 1   FIBER ADULT GUMMIES PO, Take by mouth. (Patient not taking: Reported on 04/30/2022), Disp: , Rfl:    ibuprofen  (ADVIL ) 600 MG tablet, Take 1 tablet (600 mg total) by mouth every 6 (six) hours as needed for moderate pain. (Patient not taking: Reported on 04/30/2022), Disp: 30 tablet, Rfl: 0   ondansetron  (ZOFRAN ) 4 MG tablet, Take 1 tablet (4 mg total) by mouth every 8 (eight) hours as needed for nausea or vomiting., Disp: 10 tablet, Rfl: 0   oxyCODONE  (OXY IR/ROXICODONE ) 5 MG immediate release tablet, Take 1-2 tablets (5-10 mg total) by mouth every 4 (four) hours as needed for moderate pain. (Patient not taking: Reported on 04/30/2022), Disp: 10 tablet, Rfl: 0   oxyCODONE  (ROXICODONE ) 5 MG immediate release tablet, Take 0.5-1 tablets (2.5-5 mg total) by mouth every 4 (four) hours as needed for severe pain (pain score 7-10)., Disp: 12 tablet, Rfl: 0   pantoprazole  (PROTONIX ) 40 MG tablet, Take 1 tablet (40 mg total) by mouth daily., Disp: 30 tablet, Rfl: 0   polyethylene glycol powder (GLYCOLAX /MIRALAX ) 17 GM/SCOOP powder, Take 17 g by mouth daily as needed. (Patient not taking: Reported on 04/30/2022), Disp: 510 g, Rfl: 0  Social History   Tobacco Use  Smoking Status Never  Smokeless Tobacco Never  Allergies  Allergen Reactions   Azithromycin Anaphylaxis    Other reaction(s): Airway constriction Other reaction(s): Airway constriction Other reaction(s): Respiratory Distress (ALLERGY/intolerance) Other reaction(s): Airway constriction Other reaction(s): Airway constriction   Ceftriaxone Other (See Comments)    Patient states she was given a IM dose and immediately started seizures   Bovine (Beef) Protein-Containing Drug Products    Fish Protein-Containing Drug Products    Other     Patient reports she was told by [facility] Pulmonologist that she is allergic to dust mites.    Porcine (Pork) Protein-Containing Drug Products    Sumatriptan Other (See  Comments)    Other reaction(s): Dysphagia Other reaction(s): Dysphagia Other reaction(s): Dysphagia Other reaction(s): Dysphagia   Objective:  There were no vitals filed for this visit. There is no height or weight on file to calculate BMI. Constitutional Well developed. Well nourished.  Vascular Dorsalis pedis pulses palpable bilaterally. Posterior tibial pulses palpable bilaterally. Capillary refill normal to all digits.  No cyanosis or clubbing noted. Pedal hair growth normal.  Neurologic Normal speech. Oriented to person, place, and time. Epicritic sensation to light touch grossly present bilaterally.  Dermatologic Nails well groomed and normal in appearance. No open wounds. No skin lesions.  Orthopedic: Pain along the ATFL ligament pain with plantarflexion inversion of the foot no pain with dorsiflexion eversion of the foot mild edema noted.  No concern for broken bone or fracture noted at this time.   Radiographs: IMPRESSION: 1. No tear of the inferior tibiofibular ligaments identified, with intact lateral ligamentous complex. 2. Mild sprain of the deep tibiotalar component of the deltoid ligament, with borderline thickening of the superomedial spring ligament. 3. Mild distal tibialis posterior tendinopathy and tenosynovitis; correlate clinically in assessing for tibialis posterior dysfunction. 4. Low level marrow edema in the talar head and neck medially along the posterior subtalar joint, possibly related to arthropathy or adjacent tibialis posterior tendinopathy. 5. Low level marrow edema in the distal plantar cuboid, possibly related to arthropathy or stress reaction. Assessment:   1. High ankle sprain, right, initial encounter     Plan:  Patient was evaluated and treated and all questions answered.  Right high ankle sprain - All questions and concerns were discussed with the patient extensive detail that she still continues to have pain she would benefit from  slowly transitioning out of the cam boot into a Tri-Lock ankle brace to see if she gets better relief.  I reviewed the MRI with her in extensive detail which does not show any kind of acute rupture or lateral ligament tear or high ankle sprain.  This likely is due to soft tissue injury that is not leading to tearing.  I discussed this with her she states understanding will plan on transitioning her out of the boot into a Tri-Lock ankle brace with shoes to see if there is any reduction in pain.  She agrees with the plan - She will has Tri-Lock ankle brace at home I will asked her to apply it.  She states understanding I will see her back in 4 weeks and we will reevaluate "

## 2024-09-11 ENCOUNTER — Other Ambulatory Visit: Payer: Self-pay | Admitting: Podiatry

## 2024-09-11 MED ORDER — MELOXICAM 15 MG PO TABS: 15.0000 mg | ORAL_TABLET | Freq: Every day | ORAL | 0 refills | Status: AC

## 2024-09-11 MED ORDER — LIDOCAINE 5 % EX PTCH: 1.0000 | MEDICATED_PATCH | CUTANEOUS | 0 refills | Status: AC

## 2024-09-11 MED ORDER — METHYLPREDNISOLONE 4 MG PO TBPK: ORAL_TABLET | ORAL | 0 refills | Status: AC

## 2024-09-30 ENCOUNTER — Ambulatory Visit: Payer: Self-pay | Admitting: Podiatry
